# Patient Record
Sex: Male | Born: 1965 | Race: White | Hispanic: No | Marital: Married | Smoking: Current every day smoker
Health system: Southern US, Community
[De-identification: ages and names within clinical notes are randomized; demographics above are authoritative.]

## PROBLEM LIST (undated history)

## (undated) DIAGNOSIS — F419 Anxiety disorder, unspecified: Secondary | ICD-10-CM

## (undated) HISTORY — PX: EYE SURGERY: SHX253

## (undated) HISTORY — PX: HERNIA REPAIR: SHX51

## (undated) HISTORY — DX: Anxiety disorder, unspecified: F41.9

---

## 2004-02-17 ENCOUNTER — Emergency Department (HOSPITAL_COMMUNITY): Admission: EM | Admit: 2004-02-17 | Discharge: 2004-02-17 | Payer: Self-pay

## 2004-05-24 ENCOUNTER — Ambulatory Visit: Payer: Self-pay | Admitting: Family Medicine

## 2004-06-28 ENCOUNTER — Ambulatory Visit: Payer: Self-pay | Admitting: Family Medicine

## 2005-07-03 ENCOUNTER — Ambulatory Visit: Payer: Self-pay | Admitting: Family Medicine

## 2006-08-08 ENCOUNTER — Emergency Department (HOSPITAL_COMMUNITY): Admission: EM | Admit: 2006-08-08 | Discharge: 2006-08-08 | Payer: Self-pay | Admitting: Emergency Medicine

## 2013-02-01 ENCOUNTER — Telehealth: Payer: Self-pay | Admitting: *Deleted

## 2013-02-04 ENCOUNTER — Ambulatory Visit (INDEPENDENT_AMBULATORY_CARE_PROVIDER_SITE_OTHER): Payer: PRIVATE HEALTH INSURANCE | Admitting: Family Medicine

## 2013-02-04 ENCOUNTER — Encounter: Payer: Self-pay | Admitting: Family Medicine

## 2013-02-04 VITALS — BP 175/100 | HR 72 | Temp 97.2°F | Wt 235.0 lb

## 2013-02-04 DIAGNOSIS — R05 Cough: Secondary | ICD-10-CM

## 2013-02-04 DIAGNOSIS — I10 Essential (primary) hypertension: Secondary | ICD-10-CM

## 2013-02-04 DIAGNOSIS — J329 Chronic sinusitis, unspecified: Secondary | ICD-10-CM

## 2013-02-04 DIAGNOSIS — J209 Acute bronchitis, unspecified: Secondary | ICD-10-CM

## 2013-02-04 DIAGNOSIS — R059 Cough, unspecified: Secondary | ICD-10-CM

## 2013-02-04 MED ORDER — HYDROCODONE-HOMATROPINE 5-1.5 MG/5ML PO SYRP: 5.0000 mL | ORAL_SOLUTION | Freq: Three times a day (TID) | ORAL | Status: DC | PRN

## 2013-02-04 MED ORDER — TADALAFIL 20 MG PO TABS: 10.0000 mg | ORAL_TABLET | ORAL | Status: DC | PRN

## 2013-02-04 MED ORDER — AMLODIPINE BESYLATE 10 MG PO TABS: 10.0000 mg | ORAL_TABLET | Freq: Every day | ORAL | Status: DC

## 2013-02-04 MED ORDER — AMOXICILLIN-POT CLAVULANATE 875-125 MG PO TABS: 1.0000 | ORAL_TABLET | Freq: Two times a day (BID) | ORAL | Status: DC

## 2013-02-04 MED ORDER — METHYLPREDNISOLONE (PAK) 4 MG PO TABS: ORAL_TABLET | ORAL | Status: DC

## 2013-02-04 NOTE — Progress Notes (Signed)
  Subjective:    Patient ID: Donald Reeves, male    DOB: [birth year], 47 y.o.   MRN: [id number]  HPI This 47 y.o. male presents for evaluation of sinus drainage and URI sx's for over a week. He states he feels bad.   Review of Systems    No chest pain, SOB, HA, dizziness, vision change, N/V, diarrhea, constipation, dysuria, urinary urgency or frequency, myalgias, arthralgias or rash.  Objective:   Physical Exam  Vital signs noted  Well developed well nourished male.  HEENT - Head atraumatic Normocephalic                Eyes - PERRLA, Conjuctiva - clear Sclera- Clear EOMI                Ears - EAC's Wnl TM's Wnl Gross Hearing WNL                Nose - Nares patent                 Throat - oropharanx wnl Respiratory - Lungs CTA bilateral Cardiac - RRR S1 and S2 without murmur GI - Abdomen soft Nontender and bowel sounds active x 4 Extremities - No edema. Neuro - Grossly intact.      Assessment & Plan:  Acute bronchitis - Plan: amoxicillin-clavulanate (AUGMENTIN) 875-125 MG per tablet, methylPREDNIsolone (MEDROL DOSPACK) 4 MG tablet  Sinusitis - Plan: amoxicillin-clavulanate (AUGMENTIN) 875-125 MG per tablet, methylPREDNIsolone (MEDROL DOSPACK) 4 MG tablet  Cough - Plan: HYDROcodone-homatropine (HYCODAN) 5-1.5 MG/5ML syrup, DISCONTINUED: HYDROcodone-homatropine (HYCODAN) 5-1.5 MG/5ML syrup  Essential hypertension, benign - Plan: amLODipine (NORVASC) 10 MG tablet Follow up in one month

## 2013-02-04 NOTE — Patient Instructions (Signed)

## 2013-02-09 NOTE — Telephone Encounter (Signed)
error 

## 2013-02-12 ENCOUNTER — Other Ambulatory Visit: Payer: Self-pay | Admitting: Nurse Practitioner

## 2013-02-12 MED ORDER — PANTOPRAZOLE SODIUM 40 MG PO TBEC: 40.0000 mg | DELAYED_RELEASE_TABLET | Freq: Every day | ORAL | Status: DC

## 2013-02-25 ENCOUNTER — Ambulatory Visit: Payer: PRIVATE HEALTH INSURANCE | Admitting: Family Medicine

## 2013-03-11 ENCOUNTER — Other Ambulatory Visit: Payer: Self-pay | Admitting: Nurse Practitioner

## 2013-04-14 ENCOUNTER — Other Ambulatory Visit: Payer: Self-pay | Admitting: Family Medicine

## 2013-05-19 ENCOUNTER — Other Ambulatory Visit: Payer: Self-pay | Admitting: Family Medicine

## 2013-06-23 ENCOUNTER — Other Ambulatory Visit: Payer: Self-pay | Admitting: Nurse Practitioner

## 2013-06-23 MED ORDER — OSELTAMIVIR PHOSPHATE 75 MG PO CAPS: 75.0000 mg | ORAL_CAPSULE | Freq: Every day | ORAL | Status: DC

## 2013-06-25 ENCOUNTER — Other Ambulatory Visit: Payer: Self-pay | Admitting: Family Medicine

## 2013-06-28 NOTE — Telephone Encounter (Signed)
Last seen 02/04/13  B [place] 

## 2013-07-19 ENCOUNTER — Other Ambulatory Visit: Payer: Self-pay | Admitting: Nurse Practitioner

## 2013-07-20 NOTE — Telephone Encounter (Signed)
Last seen 02/04/13  B [place]

## 2013-07-21 ENCOUNTER — Encounter (HOSPITAL_COMMUNITY): Payer: Self-pay | Admitting: Emergency Medicine

## 2013-07-21 ENCOUNTER — Emergency Department (HOSPITAL_COMMUNITY)
Admission: EM | Admit: 2013-07-21 | Discharge: 2013-07-21 | Disposition: A | Discharge status: Home / Self Care | Payer: Managed Care, Other (non HMO) | Attending: Emergency Medicine | Admitting: Emergency Medicine

## 2013-07-21 DIAGNOSIS — T148XXA Other injury of unspecified body region, initial encounter: Secondary | ICD-10-CM

## 2013-07-21 DIAGNOSIS — S139XXA Sprain of joints and ligaments of unspecified parts of neck, initial encounter: Secondary | ICD-10-CM | POA: Insufficient documentation

## 2013-07-21 DIAGNOSIS — Y9389 Activity, other specified: Secondary | ICD-10-CM | POA: Insufficient documentation

## 2013-07-21 DIAGNOSIS — F17200 Nicotine dependence, unspecified, uncomplicated: Secondary | ICD-10-CM | POA: Insufficient documentation

## 2013-07-21 DIAGNOSIS — Z79899 Other long term (current) drug therapy: Secondary | ICD-10-CM | POA: Insufficient documentation

## 2013-07-21 DIAGNOSIS — Y92410 Unspecified street and highway as the place of occurrence of the external cause: Secondary | ICD-10-CM | POA: Insufficient documentation

## 2013-07-21 MED ORDER — CYCLOBENZAPRINE HCL 10 MG PO TABS: 10.0000 mg | ORAL_TABLET | Freq: Three times a day (TID) | ORAL | Status: DC | PRN
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
## 2013-07-24 NOTE — ED Provider Notes (Signed)
Medical screening examination/treatment/procedure(s) were performed by non-physician practitioner and as supervising physician I was immediately available for consultation/collaboration.  EKG Interpretation   None         Wilba Mutz, MD 07/24/13 0715 

## 2013-07-26 ENCOUNTER — Other Ambulatory Visit: Payer: Self-pay | Admitting: Nurse Practitioner

## 2013-07-27 NOTE — Telephone Encounter (Signed)
Last saw you in 07/14, not sure if depression or cymbalta was addressed

## 2013-07-28 ENCOUNTER — Telehealth: Payer: Self-pay | Admitting: Family Medicine

## 2013-08-26 ENCOUNTER — Other Ambulatory Visit: Payer: Self-pay | Admitting: Family Medicine

## 2013-08-30 ENCOUNTER — Other Ambulatory Visit: Payer: Self-pay | Admitting: Family Medicine

## 2013-08-30 NOTE — Telephone Encounter (Signed)
Patient last seen in office on 02-04-13. Please advise

## 2013-09-01 NOTE — Telephone Encounter (Signed)
Last seen 07/14 

## 2013-09-03 ENCOUNTER — Other Ambulatory Visit: Payer: Self-pay | Admitting: Nurse Practitioner

## 2013-09-07 ENCOUNTER — Telehealth: Payer: Self-pay | Admitting: Nurse Practitioner

## 2013-09-07 NOTE — Telephone Encounter (Signed)
Last seen 02/04/13  B [place]

## 2013-09-09 ENCOUNTER — Telehealth: Payer: Self-pay | Admitting: Nurse Practitioner

## 2013-09-09 ENCOUNTER — Encounter: Payer: Self-pay | Admitting: Nurse Practitioner

## 2013-09-09 ENCOUNTER — Ambulatory Visit (INDEPENDENT_AMBULATORY_CARE_PROVIDER_SITE_OTHER): Payer: Managed Care, Other (non HMO) | Admitting: Nurse Practitioner

## 2013-09-09 VITALS — BP 136/84 | HR 72 | Temp 97.9°F | Ht 73.0 in | Wt 233.0 lb

## 2013-09-09 DIAGNOSIS — F410 Panic disorder [episodic paroxysmal anxiety] without agoraphobia: Secondary | ICD-10-CM

## 2013-09-09 DIAGNOSIS — F411 Generalized anxiety disorder: Secondary | ICD-10-CM | POA: Insufficient documentation

## 2013-09-09 MED ORDER — ALPRAZOLAM 0.25 MG PO TABS: 0.2500 mg | ORAL_TABLET | Freq: Two times a day (BID) | ORAL | Status: DC | PRN

## 2013-09-09 MED ORDER — VORTIOXETINE HBR 10 MG PO TABS: 1.0000 | ORAL_TABLET | Freq: Every day | ORAL | Status: DC

## 2013-09-09 MED ORDER — DULOXETINE HCL 30 MG PO CPEP: 30.0000 mg | ORAL_CAPSULE | Freq: Every day | ORAL | Status: DC

## 2013-09-09 NOTE — Telephone Encounter (Signed)
appt with mmm on 3/5

## 2013-09-09 NOTE — Patient Instructions (Addendum)
  1. Panic attacks   2. GAD (generalized anxiety disorder)    Meds ordered this encounter  Medications  . DULoxetine (CYMBALTA) 30 MG capsule    Sig: Take 1 capsule (30 mg total) by mouth daily.    Dispense:  30 capsule    Refill:  1    Order Specific Question:  Supervising Provider    Answer:  Ernestina PennaMOORE, DONALD W [[id number]]  . Vortioxetine HBr (BRINTELLIX) 10 MG TABS    Sig: Take 1 tablet (10 mg total) by mouth daily.    Dispense:  30 tablet    Refill:  0    Order Specific Question:  Supervising Provider    Answer:  Ernestina PennaMOORE, DONALD W [[id number]]  . ALPRAZolam (XANAX) 0.25 MG tablet    Sig: Take 1 tablet (0.25 mg total) by mouth 2 (two) times daily as needed for anxiety.    Dispense:  40 tablet    Refill:  0    Order Specific Question:  Supervising Provider    Answer:  Christell ConstantMOORE, DONALD W [[id number]]    Wean off cymbalta- 60mg  qod alt with 30mg  X1 week- then 30 mg daily X1 week- then 30mg  qod alt with brintellex 10mg  X 1 week then straightt to brentellex Xanax as needed Stress management Follow up in 3 weeks  Mary-Margaret Daphine DeutscherMartin, FNP
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
## 2013-09-09 NOTE — Telephone Encounter (Signed)
Patient wife aware

## 2013-09-09 NOTE — Telephone Encounter (Signed)
I gave him samples of brintellex- he doesn't need it called in yert has enough to last until he is seen again.

## 2013-09-13 ENCOUNTER — Telehealth: Payer: Self-pay | Admitting: Nurse Practitioner

## 2013-09-13 NOTE — Telephone Encounter (Signed)
Patient was concerned that he made me uncomfortable at visit- I told him not at all.

## 2013-09-28 ENCOUNTER — Telehealth: Payer: Self-pay | Admitting: Nurse Practitioner

## 2013-09-28 NOTE — Telephone Encounter (Signed)
appt given for thurs 

## 2013-09-30 ENCOUNTER — Ambulatory Visit (INDEPENDENT_AMBULATORY_CARE_PROVIDER_SITE_OTHER): Payer: Managed Care, Other (non HMO) | Admitting: Nurse Practitioner

## 2013-09-30 ENCOUNTER — Encounter: Payer: Self-pay | Admitting: Nurse Practitioner

## 2013-09-30 VITALS — BP 134/88 | HR 76 | Temp 98.4°F | Ht 73.0 in | Wt 234.4 lb

## 2013-09-30 DIAGNOSIS — F411 Generalized anxiety disorder: Secondary | ICD-10-CM

## 2013-09-30 DIAGNOSIS — F410 Panic disorder [episodic paroxysmal anxiety] without agoraphobia: Secondary | ICD-10-CM

## 2013-09-30 MED ORDER — ESCITALOPRAM OXALATE 10 MG PO TABS: 10.0000 mg | ORAL_TABLET | Freq: Every day | ORAL | Status: DC

## 2013-09-30 NOTE — Progress Notes (Signed)
   Subjective:    Patient ID: Donald Reeves, male    DOB: 1965/09/22, 48 y.o.   MRN: [id number]  HPI  Patient was seenon March 5,2015 c/o what we diagnosed as panic attacks- we started him on brintellex and trying  wean him off of cymbalta. He is currently alternating the meds daily. Patient says he is feeling better bit brintellex seems to keep him awake but he has really only taken a few days. Has been on other anxiety meds but not for anxiety. He doesn't think he can tolerate brintellex. Says that he tried lexapro for headache and not anxiety and it was years ago.    Review of Systems  Constitutional: Negative.   HENT: Negative.   Respiratory: Negative.   Cardiovascular: Negative.   Psychiatric/Behavioral: Negative.   All other systems reviewed and are negative.       Objective:   Physical Exam  Constitutional: He appears well-developed and well-nourished.  Cardiovascular: Normal rate, regular rhythm and normal heart sounds.   Pulmonary/Chest: Effort normal and breath sounds normal.  Abdominal: Soft. Bowel sounds are normal.  Skin: Skin is warm and dry.  Psychiatric: He has a normal mood and affect. His behavior is normal. Judgment and thought content normal.   BP 134/88  Pulse 76  Temp(Src) 98.4 F (36.9 C) (Oral)  Ht 6\' 1"  (1.854 m)  Wt 234 lb 6.4 oz (106.323 kg)  BMI 30.93 kg/m2        Assessment & Plan:   1. GAD (generalized anxiety disorder)   2. Panic attacks    Meds ordered this encounter  Medications  . escitalopram (LEXAPRO) 10 MG tablet    Sig: Take 1 tablet (10 mg total) by mouth daily.    Dispense:  30 tablet    Refill:  3    Order Specific Question:  Supervising Provider    Answer:  Ernestina PennaMOORE, DONALD W [[id number]]   Stop brintellex and replace with lexapro Continue to wean off cymbalta Stress management Follow up prn  Mary-Margaret Daphine DeutscherMartin, FNP

## 2013-09-30 NOTE — Patient Instructions (Signed)
Panic Attacks  Panic attacks are sudden, short-lived surges of severe anxiety, fear, or discomfort. They may occur for no reason when you are relaxed, when you are anxious, or when you are sleeping. Panic attacks may occur for a number of reasons:   · Healthy people occasionally have panic attacks in extreme, life-threatening situations, such as war or natural disasters. Normal anxiety is a protective mechanism of the body that helps us react to danger (fight or flight response).  · Panic attacks are often seen with anxiety disorders, such as panic disorder, social anxiety disorder, generalized anxiety disorder, and phobias. Anxiety disorders cause excessive or uncontrollable anxiety. They may interfere with your relationships or other life activities.  · Panic attacks are sometimes seen with other mental illnesses such as depression and posttraumatic stress disorder.  · Certain medical conditions, prescription medicines, and drugs of abuse can cause panic attacks.  SYMPTOMS   Panic attacks start suddenly, peak within 20 minutes, and are accompanied by four or more of the following symptoms:  · Pounding heart or fast heart rate (palpitations).  · Sweating.  · Trembling or shaking.  · Shortness of breath or feeling smothered.  · Feeling choked.  · Chest pain or discomfort.  · Nausea or strange feeling in your stomach.  · Dizziness, lightheadedness, or feeling like you will faint.  · Chills or hot flushes.  · Numbness or tingling in your lips or hands and feet.  · Feeling that things are not real or feeling that you are not yourself.  · Fear of losing control or going crazy.  · Fear of dying.  Some of these symptoms can mimic serious medical conditions. For example, you may think you are having a heart attack. Although panic attacks can be very scary, they are not life threatening.  DIAGNOSIS   Panic attacks are diagnosed through an assessment by your health care provider. Your health care provider will ask questions  about your symptoms, such as where and when they occurred. Your health care provider will also ask about your medical history and use of alcohol and drugs, including prescription medicines. Your health care provider may order blood tests or other studies to rule out a serious medical condition. Your health care provider may refer you to a mental health professional for further evaluation.  TREATMENT   · Most healthy people who have one or two panic attacks in an extreme, life-threatening situation will not require treatment.  · The treatment for panic attacks associated with anxiety disorders or other mental illness typically involves counseling with a mental health professional, medicine, or a combination of both. Your health care provider will help determine what treatment is best for you.  · Panic attacks due to physical illness usually goes away with treatment of the illness. If prescription medicine is causing panic attacks, talk with your health care provider about stopping the medicine, decreasing the dose, or substituting another medicine.  · Panic attacks due to alcohol or drug abuse goes away with abstinence. Some adults need professional help in order to stop drinking or using drugs.  HOME CARE INSTRUCTIONS   · Take all your medicines as prescribed.    · Check with your health care provider before starting new prescription or over-the-counter medicines.  · Keep all follow up appointments with your health care provider.  SEEK MEDICAL CARE IF:  · You are not able to take your medicines as prescribed.  · Your symptoms do not improve or get worse.  SEEK IMMEDIATE   MEDICAL CARE IF:   · You experience panic attack symptoms that are different than your usual symptoms.  · You have serious thoughts about hurting yourself or others.  · You are taking medicine for panic attacks and have a serious side effect.  MAKE SURE YOU:  · Understand these instructions.  · Will watch your condition.  · Will get help right away  if you are not doing well or get worse.  Document Released: 06/24/2005 Document Revised: 04/14/2013 Document Reviewed: 02/05/2013  Exit[employer]® Patient Information ©2014 [employer].

## 2013-10-18 ENCOUNTER — Other Ambulatory Visit: Payer: Self-pay | Admitting: Family Medicine

## 2013-11-01 ENCOUNTER — Encounter: Payer: Self-pay | Admitting: Nurse Practitioner

## 2013-11-01 ENCOUNTER — Ambulatory Visit (INDEPENDENT_AMBULATORY_CARE_PROVIDER_SITE_OTHER): Payer: Managed Care, Other (non HMO) | Admitting: Nurse Practitioner

## 2013-11-01 VITALS — BP 125/71 | HR 77 | Temp 97.6°F | Ht 73.0 in | Wt 237.0 lb

## 2013-11-01 DIAGNOSIS — F411 Generalized anxiety disorder: Secondary | ICD-10-CM

## 2013-11-01 DIAGNOSIS — F410 Panic disorder [episodic paroxysmal anxiety] without agoraphobia: Secondary | ICD-10-CM

## 2013-11-01 NOTE — Patient Instructions (Signed)
Stress Management Stress is a state of physical or mental tension that often results from changes in your life or normal routine. Some common causes of stress are:  Death of a loved one.  Injuries or severe illnesses.  Getting fired or changing jobs.  Moving into a new home. Other causes may be:  Sexual problems.  Business or financial losses.  Taking on a large debt.  Regular conflict with someone at home or at work.  Constant tiredness from lack of sleep. It is not just bad things that are stressful. It may be stressful to:  Win the lottery.  Get married.  Buy a new car. The amount of stress that can be easily tolerated varies from person to person. Changes generally cause stress, regardless of the types of change. Too much stress can affect your health. It may lead to physical or emotional problems. Too little stress (boredom) may also become stressful. SUGGESTIONS TO REDUCE STRESS:  Talk things over with your family and friends. It often is helpful to share your concerns and worries. If you feel your problem is serious, you may want to get help from a professional counselor.  Consider your problems one at a time instead of lumping them all together. Trying to take care of everything at once may seem impossible. List all the things you need to do and then start with the most important one. Set a goal to accomplish 2 or 3 things each day. If you expect to do too many in a single day you will naturally fail, causing you to feel even more stressed.  Do not use alcohol or drugs to relieve stress. Although you may feel better for a short time, they do not remove the problems that caused the stress. They can also be habit forming.  Exercise regularly - at least 3 times per week. Physical exercise can help to relieve that "uptight" feeling and will relax you.  The shortest distance between despair and hope is often a good night's sleep.  Go to bed and get up on time allowing  yourself time for appointments without being rushed.  Take a short "time-out" period from any stressful situation that occurs during the day. Close your eyes and take some deep breaths. Starting with the muscles in your face, tense them, hold it for a few seconds, then relax. Repeat this with the muscles in your neck, shoulders, hand, stomach, back and legs.  Take good care of yourself. Eat a balanced diet and get plenty of rest.  Schedule time for having fun. Take a break from your daily routine to relax. HOME CARE INSTRUCTIONS   Call if you feel overwhelmed by your problems and feel you can no longer manage them on your own.  Return immediately if you feel like hurting yourself or someone else. Document Released: 12/18/2000 Document Revised: 09/16/2011 Document Reviewed: 02/16/2013 Exit[employer] Patient Information 2014 [employer].  

## 2013-11-01 NOTE — Progress Notes (Signed)
   Subjective:    Patient ID: Donald Reeves, male    DOB: [birth year], 48 y.o.   MRN: [id number]  HPI Patient here today for follow up- Dx with anxiety and panic attacks- He was started on lexapro at last visit- He said he is doing much better then he was- Does have sexual side effects. Says that he is okay with that for now- He has some cialis at hoem that he will try when ready.    Review of Systems  Constitutional: Negative.   HENT: Negative.   Respiratory: Negative.   Cardiovascular: Negative.   Gastrointestinal: Negative.   Genitourinary: Negative.   Psychiatric/Behavioral: Negative.   All other systems reviewed and are negative.      Objective:   Physical Exam  Constitutional: He is oriented to person, place, and time. He appears well-developed and well-nourished.  Cardiovascular: Normal rate, regular rhythm and normal heart sounds.   Pulmonary/Chest: Effort normal and breath sounds normal.  Abdominal: Soft. Bowel sounds are normal.  Neurological: He is alert and oriented to person, place, and time.  Skin: Skin is warm and dry.  Psychiatric: He has a normal mood and affect. His behavior is normal. Judgment and thought content normal.    BP 125/71  Pulse 77  Temp(Src) 97.6 F (36.4 C) (Oral)  Ht 6\' 1"  (1.854 m)  Wt 237 lb (107.502 kg)  BMI 31.27 kg/m2       Assessment & Plan:   1. Panic attacks   2. GAD (generalized anxiety disorder)    Continue lexapro as rx daily Follow up in 3 months RTO prn  Mary-Margaret Daphine DeutscherMartin, FNP

## 2013-12-03 ENCOUNTER — Telehealth: Payer: Self-pay | Admitting: Nurse Practitioner

## 2013-12-05 ENCOUNTER — Other Ambulatory Visit: Payer: Self-pay | Admitting: Nurse Practitioner

## 2013-12-06 MED ORDER — ALPRAZOLAM 0.25 MG PO TABS: 0.2500 mg | ORAL_TABLET | Freq: Two times a day (BID) | ORAL | Status: DC | PRN

## 2013-12-06 NOTE — Telephone Encounter (Signed)
Called in.

## 2013-12-06 NOTE — Telephone Encounter (Signed)
Xanax 0.25 1po BID prn #[id number] refills

## 2013-12-22 ENCOUNTER — Ambulatory Visit: Payer: Managed Care, Other (non HMO) | Admitting: Nurse Practitioner

## 2014-01-04 ENCOUNTER — Encounter: Payer: Self-pay | Admitting: Physician Assistant

## 2014-01-04 ENCOUNTER — Ambulatory Visit (INDEPENDENT_AMBULATORY_CARE_PROVIDER_SITE_OTHER): Payer: 59 | Admitting: Physician Assistant

## 2014-01-04 VITALS — BP 130/98 | HR 60 | Temp 99.3°F | Ht 73.0 in | Wt 233.6 lb

## 2014-01-04 DIAGNOSIS — F4323 Adjustment disorder with mixed anxiety and depressed mood: Secondary | ICD-10-CM

## 2014-01-04 DIAGNOSIS — F410 Panic disorder [episodic paroxysmal anxiety] without agoraphobia: Secondary | ICD-10-CM

## 2014-01-04 MED ORDER — ESCITALOPRAM OXALATE 20 MG PO TABS: ORAL_TABLET | ORAL | Status: DC

## 2014-01-04 NOTE — Patient Instructions (Signed)
Generalized Anxiety Disorder  Generalized anxiety disorder (GAD) is a mental disorder. It interferes with life functions, including relationships, work, and school.  GAD is different from normal anxiety, which everyone experiences at some point in their lives in response to specific life events and activities. Normal anxiety actually helps us prepare for and get through these life events and activities. Normal anxiety goes away after the event or activity is over.   GAD causes anxiety that is not necessarily related to specific events or activities. It also causes excess anxiety in proportion to specific events or activities. The anxiety associated with GAD is also difficult to control. GAD can vary from mild to severe. People with severe GAD can have intense waves of anxiety with physical symptoms (panic attacks).   SYMPTOMS  The anxiety and worry associated with GAD are difficult to control. This anxiety and worry are related to many life events and activities and also occur more days than not for 6 months or longer. People with GAD also have three or more of the following symptoms (one or more in children):  · Restlessness.    · Fatigue.  · Difficulty concentrating.    · Irritability.  · Muscle tension.  · Difficulty sleeping or unsatisfying sleep.  DIAGNOSIS  GAD is diagnosed through an assessment by your caregiver. Your caregiver will ask you questions about your mood, physical symptoms, and events in your life. Your caregiver may ask you about your medical history and use of alcohol or drugs, including prescription medications. Your caregiver may also do a physical exam and blood tests. Certain medical conditions and the use of certain substances can cause symptoms similar to those associated with GAD. Your caregiver may refer you to a mental health specialist for further evaluation.  TREATMENT  The following therapies are usually used to treat GAD:   · Medication--Antidepressant medication usually is  prescribed for long-term daily control. Antianxiety medications may be added in severe cases, especially when panic attacks occur.    · Talk therapy (psychotherapy)--Certain types of talk therapy can be helpful in treating GAD by providing support, education, and guidance. A form of talk therapy called cognitive behavioral therapy can teach you healthy ways to think about and react to daily life events and activities.  · Stress management techniques--These include yoga, meditation, and exercise and can be very helpful when they are practiced regularly.  A mental health specialist can help determine which treatment is best for you. Some people see improvement with one therapy. However, other people require a combination of therapies.  Document Released: 10/19/2012 Document Reviewed: 10/19/2012  ExitCare® Patient Information ©2015 [employer]. This information is not intended to replace advice given to you by your health care provider. Make sure you discuss any questions you have with your health care provider.
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
## 2014-01-27 ENCOUNTER — Telehealth: Payer: Self-pay | Admitting: *Deleted

## 2014-01-27 MED ORDER — AZITHROMYCIN 250 MG PO TABS: ORAL_TABLET | ORAL | Status: DC

## 2014-01-27 NOTE — Telephone Encounter (Signed)
Le patient know [facility]- needs to take delsym OTC and force fluids

## 2014-01-27 NOTE — Telephone Encounter (Signed)
Patient aware.

## 2014-02-10 ENCOUNTER — Other Ambulatory Visit: Payer: Self-pay | Admitting: Physician Assistant

## 2014-02-14 ENCOUNTER — Other Ambulatory Visit: Payer: Self-pay | Admitting: *Deleted

## 2014-02-14 MED ORDER — ESCITALOPRAM OXALATE 20 MG PO TABS: ORAL_TABLET | ORAL | Status: DC

## 2014-02-17 ENCOUNTER — Other Ambulatory Visit: Payer: Self-pay | Admitting: Family Medicine

## 2014-05-02 ENCOUNTER — Other Ambulatory Visit: Payer: Self-pay | Admitting: Nurse Practitioner

## 2014-06-03 ENCOUNTER — Ambulatory Visit: Payer: 59 | Admitting: Family Medicine

## 2014-08-12 ENCOUNTER — Other Ambulatory Visit: Payer: Self-pay | Admitting: Family Medicine

## 2014-09-12 ENCOUNTER — Other Ambulatory Visit: Payer: Self-pay | Admitting: Family Medicine

## 2014-10-22 ENCOUNTER — Other Ambulatory Visit: Payer: Self-pay | Admitting: Physician Assistant

## 2014-10-24 NOTE — Telephone Encounter (Signed)
Last seen 06/15

## 2014-11-21 ENCOUNTER — Telehealth: Payer: Self-pay | Admitting: Nurse Practitioner

## 2014-11-21 NOTE — Telephone Encounter (Signed)
Patient complains of nasal congestion and sinus pressure. Appt scheduled for tomorrow at 5. Patient aware.

## 2014-11-22 ENCOUNTER — Ambulatory Visit (INDEPENDENT_AMBULATORY_CARE_PROVIDER_SITE_OTHER): Payer: 59 | Admitting: Physician Assistant

## 2014-11-22 ENCOUNTER — Encounter: Payer: Self-pay | Admitting: Physician Assistant

## 2014-11-22 VITALS — BP 147/87 | HR 69 | Temp 98.4°F | Ht 73.0 in | Wt 244.8 lb

## 2014-11-22 DIAGNOSIS — J209 Acute bronchitis, unspecified: Secondary | ICD-10-CM | POA: Diagnosis not present

## 2014-11-22 DIAGNOSIS — R062 Wheezing: Secondary | ICD-10-CM

## 2014-11-22 MED ORDER — HYDROCODONE-HOMATROPINE 5-1.5 MG/5ML PO SYRP: 5.0000 mL | ORAL_SOLUTION | Freq: Three times a day (TID) | ORAL | Status: DC | PRN

## 2014-11-22 MED ORDER — PREDNISONE 10 MG (21) PO TBPK: ORAL_TABLET | ORAL | Status: DC

## 2014-11-22 MED ORDER — ALBUTEROL SULFATE HFA 108 (90 BASE) MCG/ACT IN AERS: 2.0000 | INHALATION_SPRAY | Freq: Four times a day (QID) | RESPIRATORY_TRACT | Status: DC | PRN

## 2014-11-22 MED ORDER — AZITHROMYCIN 250 MG PO TABS: ORAL_TABLET | ORAL | Status: DC

## 2014-11-22 NOTE — Patient Instructions (Signed)
Acute Bronchitis Bronchitis is when the airways that extend from the windpipe into the lungs get red, puffy, and painful (inflamed). Bronchitis often causes thick spit (mucus) to develop. This leads to a cough. A cough is the most common symptom of bronchitis. In acute bronchitis, the condition usually begins suddenly and goes away over time (usually in 2 weeks). Smoking, allergies, and asthma can make bronchitis worse. Repeated episodes of bronchitis may cause more lung problems. HOME CARE  Rest.  Drink enough fluids to keep your pee (urine) clear or pale yellow (unless you need to limit fluids as told by your doctor).  Only take over-the-counter or prescription medicines as told by your doctor.  Avoid smoking and secondhand smoke. These can make bronchitis worse. If you are a smoker, think about using nicotine gum or skin patches. Quitting smoking will help your lungs heal faster.  Reduce the chance of getting bronchitis again by:  Washing your hands often.  Avoiding people with cold symptoms.  Trying not to touch your hands to your mouth, nose, or eyes.  Follow up with your doctor as told. GET HELP IF: Your symptoms do not improve after 1 week of treatment. Symptoms include:  Cough.  Fever.  Coughing up thick spit.  Body aches.  Chest congestion.  Chills.  Shortness of breath.  Sore throat. GET HELP RIGHT AWAY IF:   You have an increased fever.  You have chills.  You have severe shortness of breath.  You have bloody thick spit (sputum).  You throw up (vomit) often.  You lose too much body fluid (dehydration).  You have a severe headache.  You faint. MAKE SURE YOU:   Understand these instructions.  Will watch your condition.  Will get help right away if you are not doing well or get worse. Document Released: 12/11/2007 Document Revised: 02/24/2013 Document Reviewed: 12/15/2012 ExitCare Patient Information 2015 [employer]. This information is not  intended to replace advice given to you by your health care provider. Make sure you discuss any questions you have with your health care provider.  

## 2014-11-22 NOTE — Progress Notes (Signed)
   Subjective:    Patient ID: Donata ClayJonathan Muench, male    DOB: Jan 05, 1966, 49 y.o.   MRN: [id number]  HPI 49 y/o male presents with c/o worsening cough x 1 week. Began with nasal , head congestion. Has not tried any medications. No aggravating or relieving factors.     Review of Systems  Constitutional: Negative.   HENT: Positive for sinus pressure. Negative for sore throat. Congestion: head, nasal    Respiratory: Positive for cough (intermittently productive ) and wheezing. Negative for shortness of breath.   Cardiovascular: Negative.   All other systems reviewed and are negative.      Objective:   Physical Exam  Constitutional: He is oriented to person, place, and time. He appears well-developed and well-nourished. No distress.  HENT:  Head: Normocephalic.  Cardiovascular: Normal rate, regular rhythm, normal heart sounds and intact distal pulses.  Exam reveals no gallop and no friction rub.   No murmur heard. Pulmonary/Chest: Effort normal. No respiratory distress. He has wheezes (inspiratory and expiratory bilaterally anterior and posterior BS). He has no rales. He exhibits no tenderness.  Neurological: He is alert and oriented to person, place, and time.  Skin: He is not diaphoretic.  Psychiatric: He has a normal mood and affect. His behavior is normal. Judgment and thought content normal.  Nursing note and vitals reviewed.         Assessment & Plan:  1. Acute bronchitis, unspecified organism  - predniSONE (STERAPRED UNI-PAK 21 TAB) 10 MG (21) TBPK tablet; Take as directed  Dispense: 21 tablet; Refill: 0 - HYDROcodone-homatropine (HYCODAN) 5-1.5 MG/5ML syrup; Take 5 mLs by mouth every 8 (eight) hours as needed for cough.  Dispense: 120 mL; Refill: 0 - azithromycin (ZITHROMAX) 250 MG tablet; 2 tablets PO on day 1, then 1 tablet PO on day 2-5  Dispense: 6 tablet; Refill: 0  2. Wheeze  - predniSONE (STERAPRED UNI-PAK 21 TAB) 10 MG (21) TBPK tablet; Take as directed  Dispense:  21 tablet; Refill: 0 - HYDROcodone-homatropine (HYCODAN) 5-1.5 MG/5ML syrup; Take 5 mLs by mouth every 8 (eight) hours as needed for cough.  Dispense: 120 mL; Refill: 0 - azithromycin (ZITHROMAX) 250 MG tablet; 2 tablets PO on day 1, then 1 tablet PO on day 2-5  Dispense: 6 tablet; Refill: 0  - Cool mist humidifier - Plain musinex as directed.  - drink plenty of fluids    Continue all meds  RTO 1 week   Tiffany A. Chauncey ReadingGann PA-C

## 2014-11-30 ENCOUNTER — Ambulatory Visit: Payer: 59 | Admitting: Physician Assistant

## 2015-01-02 ENCOUNTER — Other Ambulatory Visit: Payer: Self-pay | Admitting: Nurse Practitioner

## 2015-03-06 ENCOUNTER — Other Ambulatory Visit: Payer: Self-pay | Admitting: Physician Assistant

## 2015-03-09 ENCOUNTER — Other Ambulatory Visit: Payer: Self-pay | Admitting: Family

## 2015-04-11 ENCOUNTER — Other Ambulatory Visit: Payer: Self-pay | Admitting: Family

## 2015-05-08 ENCOUNTER — Other Ambulatory Visit: Payer: Self-pay | Admitting: Family Medicine

## 2015-05-12 ENCOUNTER — Encounter: Payer: Self-pay | Admitting: Family Medicine

## 2015-05-12 ENCOUNTER — Ambulatory Visit (INDEPENDENT_AMBULATORY_CARE_PROVIDER_SITE_OTHER): Payer: 59 | Admitting: Family Medicine

## 2015-05-12 VITALS — BP 139/95 | HR 72 | Temp 97.2°F | Ht 73.0 in | Wt 251.0 lb

## 2015-05-12 DIAGNOSIS — B070 Plantar wart: Secondary | ICD-10-CM

## 2015-05-12 NOTE — Progress Notes (Signed)
   HPI  Patient presents today here with a painful spot on his right foot.  Patient states it is been there for 4-8 weeks. States that it was initially dark colored and has turned yellow over the last few weeks. He has pain with walking. He has no fever, chills, redness of the area, or swelling there. He's not tried any medications or therapies over-the-counter for this problem.  PMH: Smoking status noted ROS: Per HPI  Objective: BP 139/95 mmHg  Pulse 72  Temp(Src) 97.2 F (36.2 C) (Oral)  Ht 6\' 1"  (1.854 m)  Wt 251 lb (113.853 kg)  BMI 33.12 kg/m2 Gen: NAD, alert, cooperative with exam Ext: No edema, warm Neuro: Alert and oriented, No gross deficits Skin: 5mm yellow hyperkeratotic nodule on R foot in mid arch, mild tenderness to palpation   Pared down using 15 blade  To a soft core, tolerated without bleeding or pain.   Assessment and plan:  # Plantrar wart Pared own in clinic today with mild improvement in symptoms Recommend compound W and corn pad X 2 weeks, RTC if worsens or call if concern for infection Consider Dr. Ulice Brilliantrake for podiatry if needed   Orders Placed This Encounter  Procedures  . Ambulatory referral to Podiatry    Referral Priority:  Routine    Referral Type:  Consultation    Referral Reason:  Specialty Services Required    Requested Specialty:  Podiatry    Number of Visits Requested:  1    No orders of the defined types were placed in this encounter.    Murtis SinkSam Anaiah Mcmannis, MD [facility] 05/12/2015, 9:28 AM

## 2015-05-12 NOTE — Patient Instructions (Addendum)
Great to meet you!  Before applying product, soak area in warm water for 5 minutes; dry area thoroughly, then apply medication.  Compound W : Apply one drop to cover corn or callus. Let dry. Repeat once or twice daily until corn is removed for up to 14 days. .  Use a corn pad for comfort  Please let us know if you develop redness, warmth, or suden worsening of pain at that site as these could be signs of infection.   Plantar Warts Plantar warts are small growths on the bottom of the foot (sole). Warts are caused by a type of germ (virus). Most warts are not painful, and they usually do not cause problems. Sometimes, plantar warts can cause pain when you walk. Warts often go away on their own in time. Treatments may be done if needed. HOME CARE General Instructions  Apply creams or solutions only as told by your doctor. Follow these steps if your doctor tells you to do so:  Soak your foot in warm water.  Remove the top layer of softened skin before you apply the medicine. You can use a pumice stone to remove the tissue.  After you apply the medicine, put a bandage over the area of the wart.  Repeat the process every day or as told by your doctor.  Do not scratch or pick at a wart.  Wash your hands after you touch a wart.  If a wart is painful, try putting a bandage with a hole in the middle over the wart.  Keep all follow-up visits as told by your doctor. This is important. Prevention  Wear shoes and socks. Change socks every day.  Keep your feet clean and dry.  Check your feet often.  Avoid direct contact with warts on other people. GET HELP IF:  Your warts do not improve after treatment.  You have redness, swelling, or pain at the site of a wart.  You have bleeding from a wart, and the bleeding does not stop when you put light pressure on the wart.  You have diabetes and you get a wart.   This information is not intended to replace advice given to you by your  health care provider. Make sure you discuss any questions you have with your health care provider.   Document Released: 07/27/2010 Document Revised: 03/15/2015 Document Reviewed: 09/19/2014 Elsevier Interactive Patient Education [employer]2016 Else[employer] Inc.

## 2015-06-11 ENCOUNTER — Other Ambulatory Visit: Payer: Self-pay | Admitting: Family Medicine

## 2015-07-03 ENCOUNTER — Other Ambulatory Visit: Payer: Self-pay | Admitting: Family Medicine

## 2015-09-21 ENCOUNTER — Other Ambulatory Visit: Payer: Self-pay | Admitting: *Deleted

## 2015-09-21 MED ORDER — ESCITALOPRAM OXALATE 20 MG PO TABS: 20.0000 mg | ORAL_TABLET | Freq: Every day | ORAL | Status: DC

## 2015-11-23 ENCOUNTER — Other Ambulatory Visit: Payer: Self-pay | Admitting: Family Medicine

## 2015-11-23 NOTE — Telephone Encounter (Signed)
Last seen 07/21/14 MMM 

## 2015-11-28 ENCOUNTER — Ambulatory Visit (INDEPENDENT_AMBULATORY_CARE_PROVIDER_SITE_OTHER): Payer: 59 | Admitting: Family Medicine

## 2015-11-28 ENCOUNTER — Encounter: Payer: Self-pay | Admitting: Family Medicine

## 2015-11-28 VITALS — BP 150/92 | HR 67 | Temp 99.1°F | Ht 73.0 in | Wt 245.0 lb

## 2015-11-28 DIAGNOSIS — R062 Wheezing: Secondary | ICD-10-CM

## 2015-11-28 DIAGNOSIS — J209 Acute bronchitis, unspecified: Secondary | ICD-10-CM

## 2015-11-28 DIAGNOSIS — R0982 Postnasal drip: Secondary | ICD-10-CM

## 2015-11-28 MED ORDER — METHYLPREDNISOLONE ACETATE 80 MG/ML IJ SUSP
80.0000 mg | Freq: Once | INTRAMUSCULAR | Status: AC
  Administered 2015-11-28: 80 mg via INTRAMUSCULAR

## 2015-11-28 MED ORDER — ALBUTEROL SULFATE HFA 108 (90 BASE) MCG/ACT IN AERS: 2.0000 | INHALATION_SPRAY | Freq: Four times a day (QID) | RESPIRATORY_TRACT | Status: DC | PRN

## 2015-11-28 NOTE — Progress Notes (Signed)
   HPI  Patient presents today here with wheezing and cough.  Patient's lines and she's had symptoms for about 1-2 months. He states that it's irritating off and on wheezing with mild shortness of breath and cough. No chest pain No fevers, chills, sweats.  He smokes about one pack per day for the last 29 years. He is considering quitting smoking.  He has frequent throat clearing, nasal congestion. He has done well with nasal sprays in the past, he is not taking any allergy medications as time.    PMH: Smoking status noted ROS: Per HPI  Objective: BP 150/92 mmHg  Pulse 67  Temp(Src) 99.1 F (37.3 C) (Oral)  Ht 6\' 1"  (1.854 m)  Wt 245 lb (111.131 kg)  BMI 32.33 kg/m2  SpO2 99% Gen: NAD, alert, cooperative with exam HEENT: NCAT, nares with swollen turbinates left greater than right, TMs normal bilaterally, oropharynx clear CV: RRR, good S1/S2, no murmur Resp: Nonlabored, expiratory wheezes throughout Ext: No edema, warm Neuro: Alert and oriented, No gross deficits  Assessment and plan:  # Wheezing, acute bronchitis Likely multifactorial, however I'm concerned about the development of COPD Given a shot of IM steroids in clinic Start Flonase Consider quitting smoking Albuterol for cough and wheeze, after current illness is improved if he is using it more than 3-4 times a week I recommend having back for more aggressive treatment and spirometry  # Tobacco abuse Discussed quitting, he is contemplative  # Postnasal drip Most likely allergic rhinitis Recommended nasocort first is he thinks that he will have good compliance with nasal sprays   Meds ordered this encounter  Medications  . methylPREDNISolone acetate (DEPO-MEDROL) injection 80 mg    Sig:   . albuterol (PROVENTIL HFA;VENTOLIN HFA) 108 (90 Base) MCG/ACT inhaler    Sig: Inhale 2 puffs into the lungs every 6 (six) hours as needed for wheezing or shortness of breath.    Dispense:  1 Inhaler    Refill:  0     Murtis SinkSam Bradshaw, MD [facility] 11/28/2015, 6:09 PM

## 2015-11-28 NOTE — Patient Instructions (Signed)
Great to meet you!  I am worried about the development of COPD, consider quitting smoking  Call 1-800 Quit Now  Albuterol is a "rescue" inhaler to help with wheeze, cough, or shortness of breath. After you feel better if you are taking it more than 3-4 times a week please come back, that would indicate need of more aggressive treatment  Start nasocort or flonase 2 sprays per nostril daily

## 2015-12-25 ENCOUNTER — Other Ambulatory Visit: Payer: Self-pay | Admitting: Nurse Practitioner

## 2016-04-01 ENCOUNTER — Other Ambulatory Visit: Payer: Self-pay | Admitting: Nurse Practitioner

## 2016-04-08 ENCOUNTER — Encounter: Payer: Self-pay | Admitting: Family

## 2016-04-08 ENCOUNTER — Ambulatory Visit (INDEPENDENT_AMBULATORY_CARE_PROVIDER_SITE_OTHER): Payer: 59 | Admitting: Family

## 2016-04-08 VITALS — BP 140/82 | HR 63 | Temp 98.4°F | Ht 73.0 in | Wt 249.6 lb

## 2016-04-08 DIAGNOSIS — J209 Acute bronchitis, unspecified: Secondary | ICD-10-CM

## 2016-04-08 MED ORDER — AZITHROMYCIN 250 MG PO TABS: ORAL_TABLET | ORAL | 0 refills | Status: DC

## 2016-04-08 MED ORDER — PREDNISONE 10 MG (21) PO TBPK: ORAL_TABLET | ORAL | 0 refills | Status: DC

## 2016-04-08 NOTE — Progress Notes (Signed)
   Subjective:    Patient ID: Donald ClayJonathan Edmundson, male    DOB: August 25, 1965, 50 y.o.   MRN: [id number]  URI   This is a new problem. The current episode started 1 to 4 weeks ago. The problem has been gradually worsening. There has been no fever. Associated symptoms include congestion, coughing, rhinorrhea, sinus pain and wheezing. Pertinent negatives include no diarrhea, dysuria, ear pain, nausea, plugged ear sensation, sneezing or sore throat. He has tried decongestant, acetaminophen and inhaler use (flonase) for the symptoms. The treatment provided mild relief.      Review of Systems  HENT: Positive for congestion and rhinorrhea. Negative for ear pain, sneezing and sore throat.   Respiratory: Positive for cough and wheezing.   Gastrointestinal: Negative for diarrhea and nausea.  Genitourinary: Negative for dysuria.  All other systems reviewed and are negative.      Objective:   Physical Exam  Constitutional: He is oriented to person, place, and time. He appears well-developed and well-nourished. No distress.  HENT:  Head: Normocephalic.  Right Ear: External ear normal.  Left Ear: External ear normal.  Nose: Mucosal edema and rhinorrhea present.  Mouth/Throat: Posterior oropharyngeal erythema present.  Eyes: Pupils are equal, round, and reactive to light. Right eye exhibits no discharge. Left eye exhibits no discharge.  Neck: Normal range of motion. Neck supple. No thyromegaly present.  Cardiovascular: Normal rate, regular rhythm, normal heart sounds and intact distal pulses.   No murmur heard. Pulmonary/Chest: Effort normal. No respiratory distress. He has no wheezes. He has rhonchi in the right upper field, the right lower field, the left upper field and the left lower field.  Abdominal: Soft. Bowel sounds are normal. He exhibits no distension. There is no tenderness.  Musculoskeletal: Normal range of motion. He exhibits no edema or tenderness.  Neurological: He is alert and oriented  to person, place, and time. He has normal reflexes. No cranial nerve deficit.  Skin: Skin is warm and dry. No rash noted. No erythema.  Psychiatric: He has a normal mood and affect. His behavior is normal. Judgment and thought content normal.  Vitals reviewed.     BP 140/82 (BP Location: Left Arm, Patient Position: Sitting, Cuff Size: Large)   Pulse 63   Temp 98.4 F (36.9 C) (Oral)   Ht 6\' 1"  (1.854 m)   Wt 249 lb 9.6 oz (113.2 kg)   SpO2 98%   BMI 32.93 kg/m      Assessment & Plan:  1. Acute bronchitis, unspecified organism -- Take meds as prescribed - Use a cool mist humidifier  -Use saline nose sprays frequently -Saline irrigations of the nose can be very helpful if done frequently.  * 4X daily for 1 week*  * Use of a nettie pot can be helpful with this. Follow directions with this* -Force fluids -For any cough or congestion  Use plain Mucinex- regular strength or max strength is fine   * Children- consult with Pharmacist for dosing -For fever or aces or pains- take tylenol or ibuprofen appropriate for age and weight.  * for fevers greater than 101 orally you may alternate ibuprofen and tylenol every  3 hours. -Throat lozenges if help -New toothbrush in 3 days - predniSONE (STERAPRED UNI-PAK 21 TAB) 10 MG (21) TBPK tablet; Use as directed  Dispense: 21 tablet; Refill: 0 - azithromycin (ZITHROMAX) 250 MG tablet; Take 500 mg once, then 250 mg for four days  Dispense: 6 tablet; Refill: 0  Jannifer Rodneyhristy Clanton Emanuelson, FNP

## 2016-04-08 NOTE — Patient Instructions (Signed)

## 2016-04-29 ENCOUNTER — Telehealth: Payer: Self-pay | Admitting: Family

## 2016-04-30 NOTE — Telephone Encounter (Signed)
Patient informed, [facility]01/16 [facility] 8[facility]

## 2016-04-30 NOTE — Telephone Encounter (Signed)
Patient NTBS  

## 2016-05-01 ENCOUNTER — Ambulatory Visit (INDEPENDENT_AMBULATORY_CARE_PROVIDER_SITE_OTHER): Payer: 59 | Admitting: Family

## 2016-05-01 ENCOUNTER — Encounter: Payer: Self-pay | Admitting: Family

## 2016-05-01 DIAGNOSIS — R062 Wheezing: Secondary | ICD-10-CM

## 2016-05-01 DIAGNOSIS — J209 Acute bronchitis, unspecified: Secondary | ICD-10-CM | POA: Diagnosis not present

## 2016-05-01 MED ORDER — ALBUTEROL SULFATE HFA 108 (90 BASE) MCG/ACT IN AERS: 2.0000 | INHALATION_SPRAY | Freq: Four times a day (QID) | RESPIRATORY_TRACT | 0 refills | Status: DC | PRN

## 2016-05-01 MED ORDER — PREDNISONE 10 MG (21) PO TBPK: ORAL_TABLET | ORAL | 0 refills | Status: DC

## 2016-05-01 MED ORDER — LEVOFLOXACIN 500 MG PO TABS: 500.0000 mg | ORAL_TABLET | Freq: Every day | ORAL | 0 refills | Status: DC

## 2016-05-01 NOTE — Progress Notes (Signed)
Subjective:    Patient ID: Donata ClayJonathan Kalisz, male    DOB: 1966/05/15, 50 y.o.   MRN: [id number]  Cough  The current episode started 1 to 4 weeks ago. The problem has been waxing and waning. The problem occurs every few minutes. The cough is productive of purulent sputum. Associated symptoms include ear congestion, ear pain, headaches, nasal congestion, postnasal drip, rhinorrhea, shortness of breath and wheezing. Pertinent negatives include no chills, fever, myalgias or sore throat. The symptoms are aggravated by lying down. He has tried rest, OTC cough suppressant and oral steroids for the symptoms. The treatment provided mild relief. There is no history of asthma or COPD.  Headache   Associated symptoms include coughing, ear pain and rhinorrhea. Pertinent negatives include no fever or sore throat.  Sinus Problem  Associated symptoms include coughing, ear pain, headaches and shortness of breath. Pertinent negatives include no chills or sore throat.      Review of Systems  Constitutional: Negative for chills and fever.  HENT: Positive for ear pain, postnasal drip and rhinorrhea. Negative for sore throat.   Respiratory: Positive for cough, shortness of breath and wheezing.   Musculoskeletal: Negative for myalgias.  Neurological: Positive for headaches.  All other systems reviewed and are negative.      Objective:   Physical Exam  Constitutional: He is oriented to person, place, and time. He appears well-developed and well-nourished. No distress.  HENT:  Head: Normocephalic.  Right Ear: External ear normal.  Left Ear: External ear normal.  Nasal passage erythemas with mild swelling   Eyes: Pupils are equal, round, and reactive to light. Right eye exhibits no discharge. Left eye exhibits no discharge.  Neck: Normal range of motion. Neck supple. No thyromegaly present.  Cardiovascular: Normal rate, regular rhythm, normal heart sounds and intact distal pulses.   No murmur  heard. Pulmonary/Chest: Effort normal. No respiratory distress. He has wheezes in the right upper field, the right middle field, the right lower field, the left upper field, the left middle field and the left lower field.  Abdominal: Soft. Bowel sounds are normal. He exhibits no distension. There is no tenderness.  Musculoskeletal: Normal range of motion. He exhibits no edema or tenderness.  Neurological: He is alert and oriented to person, place, and time.  Skin: Skin is warm and dry. No rash noted. No erythema.  Psychiatric: He has a normal mood and affect. His behavior is normal. Judgment and thought content normal.  Vitals reviewed.     BP (!) 142/93   Pulse 64   Temp 97.1 F (36.2 C) (Oral)   Ht 6\' 1"  (1.854 m)   Wt 247 lb 9.6 oz (112.3 kg)   BMI 32.67 kg/m      Assessment & Plan:  1. Acute bronchitis, unspecified organism -- Take meds as prescribed - Use a cool mist humidifier  -Use saline nose sprays frequently -Saline irrigations of the nose can be very helpful if done frequently.  * 4X daily for 1 week*  * Use of a nettie pot can be helpful with this. Follow directions with this* -Force fluids -For any cough or congestion  Use plain Mucinex- regular strength or max strength is fine   * Children- consult with Pharmacist for dosing -For fever or aces or pains- take tylenol or ibuprofen appropriate for age and weight.  * for fevers greater than 101 orally you may alternate ibuprofen and tylenol every  3 hours. -Throat lozenges if help - albuterol (PROVENTIL HFA;VENTOLIN HFA)  108 (90 Base) MCG/ACT inhaler; Inhale 2 puffs into the lungs every 6 (six) hours as needed for wheezing or shortness of breath.  Dispense: 1 Inhaler; Refill: 0 - levofloxacin (LEVAQUIN) 500 MG tablet; Take 1 tablet (500 mg total) by mouth daily.  Dispense: 7 tablet; Refill: 0 - predniSONE (STERAPRED UNI-PAK 21 TAB) 10 MG (21) TBPK tablet; Use as directed  Dispense: 21 tablet; Refill: 0  2. Wheeze -  albuterol (PROVENTIL HFA;VENTOLIN HFA) 108 (90 Base) MCG/ACT inhaler; Inhale 2 puffs into the lungs every 6 (six) hours as needed for wheezing or shortness of breath.  Dispense: 1 Inhaler; Refill: 0  Jannifer Rodney, FNP

## 2016-05-01 NOTE — Patient Instructions (Signed)

## 2016-05-04 ENCOUNTER — Other Ambulatory Visit: Payer: Self-pay | Admitting: Nurse Practitioner

## 2016-06-02 ENCOUNTER — Other Ambulatory Visit: Payer: Self-pay | Admitting: Family

## 2016-06-20 ENCOUNTER — Encounter: Payer: Self-pay | Admitting: Family

## 2016-06-20 ENCOUNTER — Ambulatory Visit (INDEPENDENT_AMBULATORY_CARE_PROVIDER_SITE_OTHER): Payer: 59 | Admitting: Family

## 2016-06-20 VITALS — BP 141/86 | HR 67 | Temp 98.1°F | Ht 73.0 in | Wt 251.0 lb

## 2016-06-20 DIAGNOSIS — H66001 Acute suppurative otitis media without spontaneous rupture of ear drum, right ear: Secondary | ICD-10-CM | POA: Diagnosis not present

## 2016-06-20 MED ORDER — AMOXICILLIN-POT CLAVULANATE 875-125 MG PO TABS: 1.0000 | ORAL_TABLET | Freq: Two times a day (BID) | ORAL | 0 refills | Status: DC

## 2016-06-20 NOTE — Progress Notes (Signed)
   Subjective:    Patient ID: Donald ClayJonathan Nuncio, male    DOB: 12-12-1965, 50 y.o.   MRN: [id number]  Ear Fullness   There is pain in the right ear. This is a new problem. The current episode started 1 to 4 weeks ago. The problem occurs constantly. The problem has been unchanged. There has been no fever. The patient is experiencing no pain. Associated symptoms include hearing loss and rhinorrhea. Pertinent negatives include no coughing, diarrhea, ear discharge or vomiting. Treatments tried: flonase. The treatment provided no relief.      Review of Systems  HENT: Positive for hearing loss and rhinorrhea. Negative for ear discharge.   Respiratory: Negative for cough.   Gastrointestinal: Negative for diarrhea and vomiting.  All other systems reviewed and are negative.      Objective:   Physical Exam  Constitutional: He is oriented to person, place, and time. He appears well-developed and well-nourished. No distress.  HENT:  Head: Normocephalic.  Right Ear: There is tenderness. Tympanic membrane is erythematous and bulging. A middle ear effusion is present.  Left Ear: External ear normal.  Mouth/Throat: Oropharynx is clear and moist.  Eyes: Pupils are equal, round, and reactive to light. Right eye exhibits no discharge. Left eye exhibits no discharge.  Neck: Normal range of motion. Neck supple. No thyromegaly present.  Cardiovascular: Normal rate, regular rhythm, normal heart sounds and intact distal pulses.   No murmur heard. Pulmonary/Chest: Effort normal and breath sounds normal. No respiratory distress. He has no wheezes.  Abdominal: Soft. Bowel sounds are normal. He exhibits no distension. There is no tenderness.  Musculoskeletal: Normal range of motion. He exhibits no edema or tenderness.  Neurological: He is alert and oriented to person, place, and time. He has normal reflexes. No cranial nerve deficit.  Skin: Skin is warm and dry. No rash noted. No erythema.  Psychiatric: He has a  normal mood and affect. His behavior is normal. Judgment and thought content normal.  Vitals reviewed.     BP (!) 141/86   Pulse 67   Temp 98.1 F (36.7 C) (Oral)   Ht 6\' 1"  (1.854 m)   Wt 251 lb (113.9 kg)   BMI 33.12 kg/m      Assessment & Plan:  1. Acute suppurative otitis media of right ear without spontaneous rupture of tympanic membrane, recurrence not specified -Tylenol prn for pain -Force fluids -Do not stick anything into ear RTO prn - amoxicillin-clavulanate (AUGMENTIN) 875-125 MG tablet; Take 1 tablet by mouth 2 (two) times daily.  Dispense: 14 tablet; Refill: 0  Jannifer Rodneyhristy Ripley Bogosian, FNP

## 2016-06-20 NOTE — Patient Instructions (Signed)
Otitis Media, Adult Otitis media is redness, soreness, and puffiness (swelling) in the space just behind your eardrum (middle ear). It may be caused by allergies or infection. It often happens along with a cold. Follow these instructions at home:  Take your medicine as told. Finish it even if you start to feel better.  Only take over-the-counter or prescription medicines for pain, discomfort, or fever as told by your doctor.  Follow up with your doctor as told. Contact a doctor if:  You have otitis media only in one ear, or bleeding from your nose, or both.  You notice a lump on your neck.  You are not getting better in 3-5 days.  You feel worse instead of better. Get help right away if:  You have pain that is not helped with medicine.  You have puffiness, redness, or pain around your ear.  You get a stiff neck.  You cannot move part of your face (paralysis).  You notice that the bone behind your ear hurts when you touch it. This information is not intended to replace advice given to you by your health care provider. Make sure you discuss any questions you have with your health care provider. Document Released: 12/11/2007 Document Revised: 11/30/2015 Document Reviewed: 01/19/2013 Elsevier Interactive Patient Education  2017 Elsevier Inc.  

## 2016-06-28 ENCOUNTER — Telehealth: Payer: Self-pay | Admitting: Nurse Practitioner

## 2016-06-28 ENCOUNTER — Other Ambulatory Visit: Payer: Self-pay | Admitting: *Deleted

## 2016-06-28 MED ORDER — PREDNISONE 20 MG PO TABS: 20.0000 mg | ORAL_TABLET | Freq: Two times a day (BID) | ORAL | 0 refills | Status: DC

## 2016-06-28 NOTE — Telephone Encounter (Signed)
She gave him Augmentin which is a very get antibiotic and should cover most things, if his ear is still hurting then he likely needs to be seen. Another thing that he could use his Flonase and I guess we could also send him a short course of prednisone 40 mg daily for 5 days

## 2016-06-28 NOTE — Telephone Encounter (Signed)
Saw [name] 12/14 and ear is no better and is still hurting.  Can something else be called in.  It is really bothering him.  Please call.

## 2016-06-28 NOTE — Telephone Encounter (Signed)
Pt notified of recommendation Verbalizes understanding 

## 2016-09-07 ENCOUNTER — Other Ambulatory Visit: Payer: Self-pay | Admitting: Family

## 2016-10-17 ENCOUNTER — Other Ambulatory Visit: Payer: Self-pay | Admitting: *Deleted

## 2016-10-17 ENCOUNTER — Other Ambulatory Visit: Payer: Self-pay | Admitting: Family

## 2016-10-17 MED ORDER — ESCITALOPRAM OXALATE 20 MG PO TABS: ORAL_TABLET | ORAL | 2 refills | Status: DC

## 2016-11-14 ENCOUNTER — Encounter: Payer: Self-pay | Admitting: Pediatrics

## 2016-11-14 ENCOUNTER — Ambulatory Visit (INDEPENDENT_AMBULATORY_CARE_PROVIDER_SITE_OTHER): Payer: 59 | Admitting: Pediatrics

## 2016-11-14 VITALS — BP 143/89 | HR 66 | Temp 97.7°F | Ht 73.0 in | Wt 250.0 lb

## 2016-11-14 DIAGNOSIS — M542 Cervicalgia: Secondary | ICD-10-CM

## 2016-11-14 DIAGNOSIS — R030 Elevated blood-pressure reading, without diagnosis of hypertension: Secondary | ICD-10-CM

## 2016-11-14 DIAGNOSIS — T148XXA Other injury of unspecified body region, initial encounter: Secondary | ICD-10-CM

## 2016-11-14 MED ORDER — CYCLOBENZAPRINE HCL 5 MG PO TABS: 5.0000 mg | ORAL_TABLET | Freq: Three times a day (TID) | ORAL | 0 refills | Status: DC | PRN

## 2016-11-14 MED ORDER — NAPROXEN 500 MG PO TABS: 500.0000 mg | ORAL_TABLET | Freq: Two times a day (BID) | ORAL | 0 refills | Status: DC | PRN

## 2016-11-14 NOTE — Patient Instructions (Signed)
Check blood pressures at home, goal blood pressure 120s/70s

## 2016-11-14 NOTE — Progress Notes (Addendum)
  Subjective:   Patient ID: Donata ClayJonathan Hovland, male    DOB: Apr 28, 1966, 51 y.o.   MRN: [id number] CC: Neck Pain  HPI: Donata ClayJonathan Iannacone is a 51 y.o. male presenting for Neck Pain  Started after a day of lifting stepping stones 4 days ago Had a headache earlier that day shoulder has been dull aching since then Pints to back of his head to upper shoulder with where it hurts Can pinpoint one place soft tissue over shoulder where it hurts the most Can feel it stretching when he tilts his head No headaches for the past week Normal strength, sensation throughout No vision changes, blurry or double vision  Says he was told he has high blood pressure in the past but it improved with treatment of anxiety/depression with lexapro  Relevant past medical, surgical, family and social history reviewed. Allergies and medications reviewed and updated. History  Smoking Status  . Current Every Day Smoker  . Packs/day: 1.00  . Types: Cigarettes  Smokeless Tobacco  . Never Used   ROS: Per HPI   Objective:    BP (!) 143/89   Pulse 66   Temp 97.7 F (36.5 C) (Oral)   Ht 6\' 1"  (1.854 m)   Wt 250 lb (113.4 kg)   BMI 32.98 kg/m   Wt Readings from Last 3 Encounters:  11/14/16 250 lb (113.4 kg)  06/20/16 251 lb (113.9 kg)  05/01/16 247 lb 9.6 oz (112.3 kg)    Gen: NAD, alert, cooperative with exam, NCAT EYES: no conjunctival injection, or no icterus LYMPH: no cervical LAD CV: NRRR, normal S1/S2, no murmur, distal pulses 2+ b/l Resp: CTABL, no wheezes, normal WOB Ext: No edema, warm Neuro: Alert and oriented, strength equal b/l UE and LE, coordination grossly normal MSK:  Shoulders appear symmetric no point tenderness over spine ttp soft tissue top of L shoulder Normal neck ROM with turning/tilting b/l Does feel L side of neck tightening when he tilts head to R shoulder   Assessment & Plan:  Christiane HaJonathan was seen today for neck pain.  Diagnoses and all orders for this visit:  Muscle  strain Discussed gentle stretching, rest, ice, NSAIDs -     naproxen (NAPROSYN) 500 MG tablet; Take 1 tablet (500 mg total) by mouth 2 (two) times daily as needed. -     cyclobenzaprine (FLEXERIL) 5 MG tablet; Take 1 tablet (5 mg total) by mouth 3 (three) times daily as needed for muscle spasms.  Elevated blood pressure reading BPs persistently elevated in office visits Usually comes in for acute visits, has not had a physical in years No blood work in system Will check BMP today as starting on NSAIDs as above BP improved with recheck Will check BP at home over next few weeks, goal 120s/70s or less rtc for physical 3-4 weeks  Follow up plan: Return in about 4 weeks (around 12/12/2016) for physical. Rex Krasarol Jeniyah Menor, MD [facility]

## 2016-11-15 LAB — BASIC METABOLIC PANEL
BUN/Creatinine Ratio: 9 (ref 9–20)
BUN: 7 mg/dL (ref 6–24)
CO2: 27 mmol/L (ref 18–29)
Calcium: 9.4 mg/dL (ref 8.7–10.2)
Chloride: 97 mmol/L (ref 96–106)
Creatinine, Ser: 0.76 mg/dL (ref 0.76–1.27)
GFR, EST AFRICAN AMERICAN: 124 mL/min/{1.73_m2} (ref >59)
GFR, EST NON AFRICAN AMERICAN: 107 mL/min/{1.73_m2} (ref >59)
Glucose: 93 mg/dL (ref 65–99)
POTASSIUM: 4.4 mmol/L (ref 3.5–5.2)
SODIUM: 138 mmol/L (ref 134–144)

## 2016-11-19 ENCOUNTER — Encounter: Payer: Self-pay | Admitting: Family Medicine

## 2016-11-19 ENCOUNTER — Ambulatory Visit (INDEPENDENT_AMBULATORY_CARE_PROVIDER_SITE_OTHER): Payer: 59 | Admitting: Family Medicine

## 2016-11-19 VITALS — BP 160/100 | HR 58 | Temp 97.9°F | Ht 73.0 in | Wt 248.4 lb

## 2016-11-19 DIAGNOSIS — R030 Elevated blood-pressure reading, without diagnosis of hypertension: Secondary | ICD-10-CM

## 2016-11-19 DIAGNOSIS — Z0000 Encounter for general adult medical examination without abnormal findings: Secondary | ICD-10-CM

## 2016-11-19 DIAGNOSIS — M542 Cervicalgia: Secondary | ICD-10-CM

## 2016-11-19 DIAGNOSIS — E669 Obesity, unspecified: Secondary | ICD-10-CM

## 2016-11-19 MED ORDER — PREDNISONE 20 MG PO TABS: ORAL_TABLET | ORAL | 0 refills | Status: DC

## 2016-11-19 MED ORDER — OLMESARTAN MEDOXOMIL-HCTZ 20-12.5 MG PO TABS: 1.0000 | ORAL_TABLET | Freq: Every day | ORAL | 3 refills | Status: DC

## 2016-11-19 NOTE — Progress Notes (Signed)
   HPI  Patient presents today here for annual physical exam  Patient has had elevated blood pressure last week when he was seen for neck pain. He states at home it still been 150s and 60s over 90s mostly. No chest pain, dyspnea, palpitations. He has had some leg edema.  Neck pain is persistent, left-sided neck pain with radiation down to the arm. No arm weakness. Naprosyn is helping somewhat, Flexeril has not been tried.  Patient is not exercising routinely, he is motivated to change that and is coming up with a plan   PMH: Smoking status noted ROS: Per HPI  Objective: BP (!) 160/100   Pulse (!) 58   Temp 97.9 F (36.6 C) (Oral)   Ht _0  (1.854 m)   Wt 248 lb 6.4 oz (112.7 kg)   BMI 32.77 kg/m  Gen: NAD, alert, cooperative with exam HEENT: NCAT, EOMI, PERRL, left eye is slightly down and laterally displaced on forward gaze, oropharynx clear, TMs normal bilaterally CV: RRR, good S1/S2, no murmur Resp: CTABL, no wheezes, non-labored Abd: SNTND, BS present, no guarding or organomegaly Ext: No edema, warm Neuro: Alert and oriented, No gross deficits  Assessment and plan:  # Annual physical exam Normal exam except for obesity and blood pressure which were discussed. Rickman in therapeutic lifestyle changes with reducing salt in the diet, losing weight, increasing regular physical activity  # Neck pain Radicular neck pain, helped minimally by NSAIDs, we'll go ahead and prescribe short course of steroids. As discussed with previous provider this could be causing some of his elevated blood pressure. I recommended starting medication if blood pressure does not normalize (defined as less than 140/80) after pain is improved.  # Elevated blood pressure Likely new diagnosis of hypertension Start olmesartan/HCTZ, and normal previously, repeat labs in 4-6 weeks just before follow-up. Patient will start medication next week if blood pressure is not normalized after neck pain has  completely improved. He understands this plan well   Orders Placed This Encounter  Procedures  . CBC with Differential/Platelet    Standing Status:   Future    Standing Expiration Date:   11/19/2017  . CMP14+EGFR    Standing Status:   Future    Standing Expiration Date:   11/19/2017  . Lipid panel    Standing Status:   Future    Standing Expiration Date:   11/19/2017  . TSH    Standing Status:   Future    Standing Expiration Date:   11/19/2017    Meds ordered this encounter  Medications  . predniSONE (DELTASONE) 20 MG tablet    Sig: 2 po at sametime daily for 5 days- start tomorrow    Dispense:  10 tablet    Refill:  0  . olmesartan-hydrochlorothiazide (BENICAR HCT) 20-12.5 MG tablet    Sig: Take 1 tablet by mouth daily.    Dispense:  30 tablet    Refill:  [place], MD [place] 11/19/2016, 2:55 PM

## 2016-11-19 NOTE — Patient Instructions (Addendum)
Great to see you!  For the neck pain:  Start the prednisone tomorrow morning with food.  After the course is over and the neck pain is better, Continue checking your BP.  If your BP is over 140/90 consistently then start olmesartan/HCTZ  Come back in 4-6 weeks for follow up, come in for a lab appointment about 1 week before to get your labs fasting

## 2016-12-17 ENCOUNTER — Other Ambulatory Visit: Payer: 59

## 2016-12-17 DIAGNOSIS — M542 Cervicalgia: Secondary | ICD-10-CM

## 2016-12-17 DIAGNOSIS — E669 Obesity, unspecified: Secondary | ICD-10-CM

## 2016-12-17 DIAGNOSIS — R030 Elevated blood-pressure reading, without diagnosis of hypertension: Secondary | ICD-10-CM

## 2016-12-18 LAB — CMP14+EGFR
ALT: 27 IU/L (ref 0–44)
AST: 21 IU/L (ref 0–40)
Albumin/Globulin Ratio: 1.8 (ref 1.2–2.2)
Albumin: 4.3 g/dL (ref 3.5–5.5)
Alkaline Phosphatase: 75 IU/L (ref 39–117)
BILIRUBIN TOTAL: 0.4 mg/dL (ref 0.0–1.2)
BUN/Creatinine Ratio: 18 (ref 9–20)
BUN: 15 mg/dL (ref 6–24)
CALCIUM: 9.6 mg/dL (ref 8.7–10.2)
CHLORIDE: 95 mmol/L — AB (ref 96–106)
CO2: 26 mmol/L (ref 20–29)
Creatinine, Ser: 0.85 mg/dL (ref 0.76–1.27)
GFR calc non Af Amer: 102 mL/min/{1.73_m2} (ref >59)
GFR, EST AFRICAN AMERICAN: 117 mL/min/{1.73_m2} (ref >59)
GLUCOSE: 95 mg/dL (ref 65–99)
Globulin, Total: 2.4 g/dL (ref 1.5–4.5)
Potassium: 4 mmol/L (ref 3.5–5.2)
Sodium: 138 mmol/L (ref 134–144)
TOTAL PROTEIN: 6.7 g/dL (ref 6.0–8.5)

## 2016-12-18 LAB — CBC WITH DIFFERENTIAL/PLATELET
BASOS: 0 %
Basophils Absolute: 0 10*3/uL (ref 0.0–0.2)
EOS (ABSOLUTE): 0.3 10*3/uL (ref 0.0–0.4)
EOS: 4 %
HEMATOCRIT: 43.6 % (ref 37.5–51.0)
HEMOGLOBIN: 15 g/dL (ref 13.0–17.7)
Immature Grans (Abs): 0 10*3/uL (ref 0.0–0.1)
Immature Granulocytes: 0 %
LYMPHS ABS: 2.2 10*3/uL (ref 0.7–3.1)
Lymphs: 29 %
MCH: 30.4 pg (ref 26.6–33.0)
MCHC: 34.4 g/dL (ref 31.5–35.7)
MCV: 88 fL (ref 79–97)
MONOCYTES: 9 %
MONOS ABS: 0.6 10*3/uL (ref 0.1–0.9)
NEUTROS ABS: 4.3 10*3/uL (ref 1.4–7.0)
Neutrophils: 58 %
Platelets: 222 10*3/uL (ref 150–379)
RBC: 4.93 x10E6/uL (ref 4.14–5.80)
RDW: 12.8 % (ref 12.3–15.4)
WBC: 7.3 10*3/uL (ref 3.4–10.8)

## 2016-12-18 LAB — LIPID PANEL
Chol/HDL Ratio: 5.1 ratio — ABNORMAL HIGH (ref 0.0–5.0)
Cholesterol, Total: 187 mg/dL (ref 100–199)
HDL: 37 mg/dL — ABNORMAL LOW (ref >39)
LDL Calculated: 107 mg/dL — ABNORMAL HIGH (ref 0–99)
Triglycerides: 217 mg/dL — ABNORMAL HIGH (ref 0–149)
VLDL CHOLESTEROL CAL: 43 mg/dL — AB (ref 5–40)

## 2016-12-18 LAB — TSH: TSH: 2.38 u[IU]/mL (ref 0.450–4.500)

## 2016-12-24 ENCOUNTER — Ambulatory Visit (INDEPENDENT_AMBULATORY_CARE_PROVIDER_SITE_OTHER): Payer: 59 | Admitting: Family Medicine

## 2016-12-24 ENCOUNTER — Encounter: Payer: Self-pay | Admitting: Family Medicine

## 2016-12-24 VITALS — BP 110/66 | HR 79 | Temp 97.3°F | Ht 73.0 in | Wt 243.0 lb

## 2016-12-24 DIAGNOSIS — I10 Essential (primary) hypertension: Secondary | ICD-10-CM | POA: Diagnosis not present

## 2016-12-24 DIAGNOSIS — N529 Male erectile dysfunction, unspecified: Secondary | ICD-10-CM | POA: Diagnosis not present

## 2016-12-24 DIAGNOSIS — Z1211 Encounter for screening for malignant neoplasm of colon: Secondary | ICD-10-CM

## 2016-12-24 MED ORDER — SILDENAFIL CITRATE 20 MG PO TABS: ORAL_TABLET | ORAL | 5 refills | Status: DC

## 2016-12-24 NOTE — Patient Instructions (Addendum)
Great to see you!  Come back in 6 months unless you need us sooner.   Try sildinafil, it is not covered by insurance for use like this but is available in town for about 1 dollar a pill.

## 2016-12-24 NOTE — Progress Notes (Signed)
   HPI  Patient presents today here to follow-up for hypertension.  Hypertension Started on olmesartan/HCTZ last visit, tolerating well. No dizziness Blood pressure 110-120 systolic. No side effects.  Erectile dysfunction, long-standing, previously had headache with Crestor, would like to try something else if it's affordable.  Screening for colon cancer Patient is interested in a colonoscopy  PMH: Smoking status noted ROS: Per HPI  Objective: BP 110/66   Pulse 79   Temp 97.3 F (36.3 C) (Oral)   Ht 6\' 1"  (1.854 m)   Wt 243 lb (110.2 kg)   BMI 32.06 kg/m  Gen: NAD, alert, cooperative with exam HEENT: NCAT CV: RRR, good S1/S2, no murmur Resp: CTABL, no wheezes, non-labored Ext: No edema, warm Neuro: Alert and oriented, No gross deficits  Assessment and plan:  # Hypertension Well-controlled on olmesartan/HCTZ Repeat labs look good, no change in creatinine  # Erectile dysfunction Long-standing problem, patient cannot afford brand name Viagra or Cialis Trial of generic sildenafil  # Treating for colon cancer Referred for colonoscopy, discussed pros and cons of screening, he agrees      Orders Placed This Encounter  Procedures  . Ambulatory referral to Gastroenterology    Referral Priority:   Routine    Referral Type:   Consultation    Referral Reason:   Specialty Services Required    Number of Visits Requested:   1    Meds ordered this encounter  Medications  . sildenafil (REVATIO) 20 MG tablet    Sig: 2 to 5 tabs once daily as needed for erectile dysfunction    Dispense:  30 tablet    Refill:  5    Murtis SinkSam Drake [place], MD [facility] 12/24/2016, 4:07 PM

## 2016-12-30 ENCOUNTER — Other Ambulatory Visit: Payer: Self-pay

## 2016-12-30 MED ORDER — OLMESARTAN MEDOXOMIL-HCTZ 20-12.5 MG PO TABS: 1.0000 | ORAL_TABLET | Freq: Every day | ORAL | 1 refills | Status: DC

## 2017-01-13 ENCOUNTER — Other Ambulatory Visit: Payer: Self-pay | Admitting: Family

## 2017-01-24 ENCOUNTER — Encounter: Payer: Self-pay | Admitting: Family Medicine

## 2017-03-21 ENCOUNTER — Other Ambulatory Visit: Payer: Self-pay

## 2017-03-21 MED ORDER — ESCITALOPRAM OXALATE 20 MG PO TABS: ORAL_TABLET | ORAL | 0 refills | Status: DC

## 2017-04-28 ENCOUNTER — Encounter: Payer: Self-pay | Admitting: Physician Assistant

## 2017-04-28 ENCOUNTER — Ambulatory Visit (INDEPENDENT_AMBULATORY_CARE_PROVIDER_SITE_OTHER): Payer: 59 | Admitting: Physician Assistant

## 2017-04-28 DIAGNOSIS — J209 Acute bronchitis, unspecified: Secondary | ICD-10-CM

## 2017-04-28 DIAGNOSIS — R062 Wheezing: Secondary | ICD-10-CM | POA: Diagnosis not present

## 2017-04-28 MED ORDER — CEFDINIR 300 MG PO CAPS: 300.0000 mg | ORAL_CAPSULE | Freq: Two times a day (BID) | ORAL | 0 refills | Status: DC

## 2017-04-28 MED ORDER — ALBUTEROL SULFATE HFA 108 (90 BASE) MCG/ACT IN AERS: 2.0000 | INHALATION_SPRAY | Freq: Four times a day (QID) | RESPIRATORY_TRACT | 1 refills | Status: DC | PRN

## 2017-04-28 MED ORDER — PREDNISONE 10 MG (48) PO TBPK
ORAL_TABLET | ORAL | 0 refills | Status: DC
Start: 2017-04-28 — End: 2018-03-26

## 2017-04-28 MED ORDER — FLUTICASONE PROPIONATE 50 MCG/ACT NA SUSP: 2.0000 | Freq: Every day | NASAL | 11 refills | Status: DC

## 2017-04-28 NOTE — Progress Notes (Signed)
BP 115/76   Pulse 69   Temp 98.4 F (36.9 C) (Oral)   Ht 6\' 1"  (1.854 m)   Wt 248 lb (112.5 kg)   BMI 32.72 kg/m    Subjective:    Patient ID: Donald Reeves, male    DOB: 01-23-1966, 51 y.o.   MRN: [id number]  HPI: Donald Reeves is a 51 y.o. male presenting on 04/28/2017 for head congestion and Sinusitis  .This patient has had many days of sinus headache and postnasal drainage. There is copious drainage at times. Denies any fever at this time. There has been a history of sinus infections in the past.  No history of sinus surgery. There is cough at night. It has become more prevalent in recent days.  Relevant past medical, surgical, family and social history reviewed and updated as indicated. Allergies and medications reviewed and updated.  Past Medical History:  Diagnosis Date  . Anxiety     Past Surgical History:  Procedure Laterality Date  . EYE SURGERY    . HERNIA REPAIR      Review of Systems  Constitutional: Positive for fatigue. Negative for appetite change.  HENT: Positive for sinus pressure and sore throat.   Eyes: Negative.  Negative for pain and visual disturbance.  Respiratory: Positive for shortness of breath and wheezing. Negative for cough and chest tightness.   Cardiovascular: Negative.  Negative for chest pain, palpitations and leg swelling.  Gastrointestinal: Negative.  Negative for abdominal pain, diarrhea, nausea and vomiting.  Endocrine: Negative.   Genitourinary: Negative.   Musculoskeletal: Positive for back pain and myalgias.  Skin: Negative.  Negative for color change and rash.  Neurological: Positive for headaches. Negative for weakness and numbness.  Psychiatric/Behavioral: Negative.     Allergies as of 04/28/2017   No Known Allergies     Medication List       Accurate as of 04/28/17  3:08 PM. Always use your most recent med list.          albuterol 108 (90 Base) MCG/ACT inhaler Commonly known as:  PROVENTIL HFA;VENTOLIN  HFA Inhale 2 puffs into the lungs every 6 (six) hours as needed for wheezing or shortness of breath.   cefdinir 300 MG capsule Commonly known as:  OMNICEF Take 1 capsule (300 mg total) by mouth 2 (two) times daily. 1 po BID   cyclobenzaprine 5 MG tablet Commonly known as:  FLEXERIL Take 1 tablet (5 mg total) by mouth 3 (three) times daily as needed for muscle spasms.   escitalopram 20 MG tablet Commonly known as:  LEXAPRO TAKE 1 TABLET (20 MG TOTAL) BY MOUTH DAILY.   fluticasone 50 MCG/ACT nasal spray Commonly known as:  FLONASE Place 2 sprays into both nostrils daily.   naproxen 500 MG tablet Commonly known as:  NAPROSYN Take 1 tablet (500 mg total) by mouth 2 (two) times daily as needed.   olmesartan-hydrochlorothiazide 20-12.5 MG tablet Commonly known as:  BENICAR HCT Take 1 tablet by mouth daily.   predniSONE 10 MG (48) Tbpk tablet Commonly known as:  STERAPRED UNI-PAK 48 TAB Take as directed   sildenafil 20 MG tablet Commonly known as:  REVATIO 2 to 5 tabs once daily as needed for erectile dysfunction          Objective:    BP 115/76   Pulse 69   Temp 98.4 F (36.9 C) (Oral)   Ht 6\' 1"  (1.854 m)   Wt 248 lb (112.5 kg)   BMI 32.72 kg/m  No Known Allergies  Physical Exam  Constitutional: He is oriented to person, place, and time. He appears well-developed and well-nourished.  HENT:  Head: Normocephalic and atraumatic.  Right Ear: Tympanic membrane and external ear normal. No middle ear effusion.  Left Ear: Tympanic membrane and external ear normal.  No middle ear effusion.  Nose: Mucosal edema and rhinorrhea present. Right sinus exhibits no maxillary sinus tenderness. Left sinus exhibits no maxillary sinus tenderness.  Mouth/Throat: Uvula is midline. Posterior oropharyngeal erythema present.  Eyes: Pupils are equal, round, and reactive to light. Conjunctivae and EOM are normal. Right eye exhibits no discharge. Left eye exhibits no discharge.  Neck:  Normal range of motion.  Cardiovascular: Normal rate, regular rhythm and normal heart sounds.   Pulmonary/Chest: Effort normal and breath sounds normal. No respiratory distress. He has no wheezes.  Abdominal: Soft.  Lymphadenopathy:    He has no cervical adenopathy.  Neurological: He is alert and oriented to person, place, and time.  Skin: Skin is warm and dry.  Psychiatric: He has a normal mood and affect.        Assessment & Plan:   1. Acute bronchitis, unspecified organism  - albuterol (PROVENTIL HFA;VENTOLIN HFA) 108 (90 Base) MCG/ACT inhaler; Inhale 2 puffs into the lungs every 6 (six) hours as needed for wheezing or shortness of breath.  Dispense: 1 Inhaler; Refill: 1  2. Wheeze - albuterol (PROVENTIL HFA;VENTOLIN HFA) 108 (90 Base) MCG/ACT inhaler; Inhale 2 puffs into the lungs every 6 (six) hours as needed for wheezing or shortness of breath.  Dispense: 1 Inhaler; Refill: 1    Current Outpatient Prescriptions:  .  albuterol (PROVENTIL HFA;VENTOLIN HFA) 108 (90 Base) MCG/ACT inhaler, Inhale 2 puffs into the lungs every 6 (six) hours as needed for wheezing or shortness of breath., Disp: 1 Inhaler, Rfl: 1 .  cyclobenzaprine (FLEXERIL) 5 MG tablet, Take 1 tablet (5 mg total) by mouth 3 (three) times daily as needed for muscle spasms., Disp: 30 tablet, Rfl: 0 .  escitalopram (LEXAPRO) 20 MG tablet, TAKE 1 TABLET (20 MG TOTAL) BY MOUTH DAILY., Disp: 30 tablet, Rfl: 2 .  naproxen (NAPROSYN) 500 MG tablet, Take 1 tablet (500 mg total) by mouth 2 (two) times daily as needed., Disp: 30 tablet, Rfl: 0 .  olmesartan-hydrochlorothiazide (BENICAR HCT) 20-12.5 MG tablet, Take 1 tablet by mouth daily., Disp: 90 tablet, Rfl: 1 .  sildenafil (REVATIO) 20 MG tablet, 2 to 5 tabs once daily as needed for erectile dysfunction, Disp: 30 tablet, Rfl: 5 .  cefdinir (OMNICEF) 300 MG capsule, Take 1 capsule (300 mg total) by mouth 2 (two) times daily. 1 po BID, Disp: 20 capsule, Rfl: 0 .  fluticasone  (FLONASE) 50 MCG/ACT nasal spray, Place 2 sprays into both nostrils daily., Disp: 16 g, Rfl: 11 .  predniSONE (STERAPRED UNI-PAK 48 TAB) 10 MG (48) TBPK tablet, Take as directed, Disp: 48 tablet, Rfl: 0 Continue all other maintenance medications as listed above.  Follow up plan: Return if symptoms worsen or fail to improve.  Educational handout given for survey  Remus LofflerAngel S. Britany Callicott PA-C [facility] [address]  [place], [place] [address] [phone]   04/28/2017, 3:08 PM

## 2017-04-28 NOTE — Patient Instructions (Signed)
In a few days you may receive a survey in the mail or online from [employer] regarding your visit with us today. Please take a moment to fill this out. Your feedback is very important to our whole office. It can help us better understand your needs as well as improve your experience and satisfaction. Thank you for taking your time to complete it. We care about you.  Jackie Littlejohn, PA-C  

## 2017-06-21 ENCOUNTER — Other Ambulatory Visit: Payer: Self-pay | Admitting: Family

## 2017-07-03 ENCOUNTER — Other Ambulatory Visit: Payer: Self-pay | Admitting: *Deleted

## 2017-07-03 MED ORDER — OLMESARTAN MEDOXOMIL-HCTZ 20-12.5 MG PO TABS: 1.0000 | ORAL_TABLET | Freq: Every day | ORAL | 1 refills | Status: DC

## 2017-09-02 ENCOUNTER — Other Ambulatory Visit: Payer: Self-pay | Admitting: *Deleted

## 2017-09-02 MED ORDER — ESCITALOPRAM OXALATE 20 MG PO TABS: ORAL_TABLET | ORAL | 0 refills | Status: DC

## 2017-11-25 ENCOUNTER — Other Ambulatory Visit: Payer: Self-pay | Admitting: Nurse Practitioner

## 2018-02-23 ENCOUNTER — Other Ambulatory Visit: Payer: Self-pay | Admitting: Nurse Practitioner

## 2018-02-25 ENCOUNTER — Other Ambulatory Visit: Payer: Self-pay | Admitting: *Deleted

## 2018-02-25 MED ORDER — OLMESARTAN MEDOXOMIL-HCTZ 20-12.5 MG PO TABS: 1.0000 | ORAL_TABLET | Freq: Every day | ORAL | 0 refills | Status: DC

## 2018-03-19 ENCOUNTER — Other Ambulatory Visit: Payer: Self-pay | Admitting: Nurse Practitioner

## 2018-03-19 NOTE — Telephone Encounter (Signed)
Last seen 04/28/17  MMM  Needs to be seen

## 2018-03-21 ENCOUNTER — Other Ambulatory Visit: Payer: Self-pay | Admitting: Nurse Practitioner

## 2018-03-23 NOTE — Telephone Encounter (Signed)
[facility]28/17  [facility]   Needs [facility] be seen

## 2018-03-23 NOTE — Telephone Encounter (Signed)
appt made

## 2018-03-24 ENCOUNTER — Other Ambulatory Visit: Payer: Self-pay | Admitting: Physician Assistant

## 2018-03-24 DIAGNOSIS — J209 Acute bronchitis, unspecified: Secondary | ICD-10-CM

## 2018-03-24 DIAGNOSIS — R062 Wheezing: Secondary | ICD-10-CM

## 2018-03-25 NOTE — Telephone Encounter (Signed)
Ov 03/26/18 

## 2018-03-26 ENCOUNTER — Ambulatory Visit: Payer: 59 | Admitting: Nurse Practitioner

## 2018-03-26 ENCOUNTER — Encounter: Payer: Self-pay | Admitting: Nurse Practitioner

## 2018-03-26 VITALS — BP 104/67 | HR 73 | Temp 96.9°F | Ht 73.0 in | Wt 251.0 lb

## 2018-03-26 DIAGNOSIS — I10 Essential (primary) hypertension: Secondary | ICD-10-CM | POA: Diagnosis not present

## 2018-03-26 DIAGNOSIS — Z23 Encounter for immunization: Secondary | ICD-10-CM | POA: Diagnosis not present

## 2018-03-26 DIAGNOSIS — F410 Panic disorder [episodic paroxysmal anxiety] without agoraphobia: Secondary | ICD-10-CM | POA: Diagnosis not present

## 2018-03-26 DIAGNOSIS — F411 Generalized anxiety disorder: Secondary | ICD-10-CM

## 2018-03-26 DIAGNOSIS — Z125 Encounter for screening for malignant neoplasm of prostate: Secondary | ICD-10-CM

## 2018-03-26 DIAGNOSIS — F17200 Nicotine dependence, unspecified, uncomplicated: Secondary | ICD-10-CM | POA: Insufficient documentation

## 2018-03-26 DIAGNOSIS — Z1211 Encounter for screening for malignant neoplasm of colon: Secondary | ICD-10-CM

## 2018-03-26 DIAGNOSIS — Z1212 Encounter for screening for malignant neoplasm of rectum: Secondary | ICD-10-CM

## 2018-03-26 MED ORDER — OLMESARTAN MEDOXOMIL-HCTZ 20-12.5 MG PO TABS: 1.0000 | ORAL_TABLET | Freq: Every day | ORAL | 1 refills | Status: DC

## 2018-03-26 MED ORDER — ESCITALOPRAM OXALATE 20 MG PO TABS: 20.0000 mg | ORAL_TABLET | Freq: Every day | ORAL | 1 refills | Status: DC

## 2018-03-26 NOTE — Patient Instructions (Signed)
Edema Edema is when you have too much fluid in your body or under your skin. Edema may make your legs, feet, and ankles swell up. Swelling is also common in looser tissues, like around your eyes. This is a common condition. It gets more common as you get older. There are many possible causes of edema. Eating too much salt (sodium) and being on your feet or sitting for a long time can cause edema in your legs, feet, and ankles. Hot weather may make edema worse. Edema is usually painless. Your skin may look swollen or shiny. Follow these instructions at home:  Keep the swollen body part raised (elevated) above the level of your heart when you are sitting or lying down.  Do not sit still or stand for a long time.  Do not wear tight clothes. Do not wear garters on your upper legs.  Exercise your legs. This can help the swelling go down.  Wear elastic bandages or support stockings as told by your doctor.  Eat a low-salt (low-sodium) diet to reduce fluid as told by your doctor.  Depending on the cause of your swelling, you may need to limit how much fluid you drink (fluid restriction).  Take over-the-counter and prescription medicines only as told by your doctor. Contact a doctor if:  Treatment is not working.  You have heart, liver, or kidney disease and have symptoms of edema.  You have sudden and unexplained weight gain. Get help right away if:  You have shortness of breath or chest pain.  You cannot breathe when you lie down.  You have pain, redness, or warmth in the swollen areas.  You have heart, liver, or kidney disease and get edema all of a sudden.  You have a fever and your symptoms get worse all of a sudden. Summary  Edema is when you have too much fluid in your body or under your skin.  Edema may make your legs, feet, and ankles swell up. Swelling is also common in looser tissues, like around your eyes.  Raise (elevate) the swollen body part above the level of your  heart when you are sitting or lying down.  Follow your doctor's instructions about diet and how much fluid you can drink (fluid restriction). This information is not intended to replace advice given to you by your health care provider. Make sure you discuss any questions you have with your health care provider. Document Released: 12/11/2007 Document Revised: 07/12/2016 Document Reviewed: 07/12/2016 Elsevier Interactive Patient Education  2017 Elsevier Inc.  

## 2018-03-26 NOTE — Addendum Note (Signed)
Addended by: Cleda DaubUCKER, AMANDA G on: 03/26/2018 05:06 PM   Modules accepted: Orders

## 2018-03-26 NOTE — Progress Notes (Signed)
Subjective:    Patient ID: Donald Reeves, male    DOB: 1966/01/28, 52 y.o.   MRN: [id number]   Chief Complaint: Medical Management of Chronic Issues   HPI:  1. Essential hypertension  -does check BP at home -low 100s/60-70s -tries to watch diet, does not add table salt to meals -no HA/CP/SOB  2. Panic attacks  -controlled on Lexapro -has RX for xanax but does not take it   3. GAD (generalized anxiety disorder)  -symptoms on the Lexapro   4.      Smoker          1 pak per day for at least 30 years- has tried chantix in the past but           gave him crazy dreams.  Outpatient Encounter Medications as of 03/26/2018  Medication Sig  . albuterol (PROVENTIL HFA;VENTOLIN HFA) 108 (90 Base) MCG/ACT inhaler INHALE 2 PUFFS BY MOUTH EVERY 6 HOURS AS NEEDED FOR WHEEZE OR SHORTNESS OF BREATH  . escitalopram (LEXAPRO) 20 MG tablet TAKE 1 TABLET BY MOUTH EVERY DAY  . fluticasone (FLONASE) 50 MCG/ACT nasal spray Place 2 sprays into both nostrils daily.  Marland Kitchen olmesartan-hydrochlorothiazide (BENICAR HCT) 20-12.5 MG tablet Take 1 tablet by mouth daily. Needs to be seen before next refill  . sildenafil (REVATIO) 20 MG tablet 2 to 5 tabs once daily as needed for erectile dysfunction  . cyclobenzaprine (FLEXERIL) 5 MG tablet Take 1 tablet (5 mg total) by mouth 3 (three) times daily as needed for muscle spasms. (Patient not taking: Reported on 03/26/2018)  . naproxen (NAPROSYN) 500 MG tablet Take 1 tablet (500 mg total) by mouth 2 (two) times daily as needed. (Patient not taking: Reported on 03/26/2018)  . [DISCONTINUED] cefdinir (OMNICEF) 300 MG capsule Take 1 capsule (300 mg total) by mouth 2 (two) times daily. 1 po BID  . [DISCONTINUED] predniSONE (STERAPRED UNI-PAK 48 TAB) 10 MG (48) TBPK tablet Take as directed   No facility-administered encounter medications on file as of 03/26/2018.       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  HENT: Negative.   Eyes: Negative.   Respiratory: Negative for cough and shortness of breath.   Cardiovascular: Negative for chest pain, palpitations and leg swelling.  Gastrointestinal: Negative for blood in stool, constipation and nausea.  Endocrine: Negative for cold intolerance and heat intolerance.  Genitourinary: Negative for difficulty urinating and dysuria.  Musculoskeletal: Negative for back pain.  Skin: Negative for rash.  Allergic/Immunologic: Negative for environmental allergies and food allergies.  Neurological: Negative for dizziness and weakness.  Hematological: Does not bruise/bleed easily.  Psychiatric/Behavioral: Negative for self-injury, sleep disturbance and suicidal ideas.       Objective:   Physical Exam  Constitutional: He is oriented to person, place, and time. He appears well-developed and well-nourished.  HENT:  Head: Normocephalic and atraumatic.  Right Ear: External ear normal.  Left Ear: External ear normal.  Nose: Nose normal.  Mouth/Throat: Uvula is midline and oropharynx is clear and moist.  Eyes: Pupils are equal, round, and reactive to light. Conjunctivae and EOM are normal.  Neck: Normal range of motion. Neck supple.  Cardiovascular: Normal rate, regular rhythm, normal heart sounds and intact distal pulses.  Pulmonary/Chest: Effort normal. He has decreased breath sounds in the left lower field.  Abdominal: Soft. Bowel sounds are  normal.  Rounded, obese abdomen  Musculoskeletal: Normal range of motion. He exhibits edema (+1 bilateral pedal edema).       Right ankle: He exhibits swelling. He exhibits normal pulse. No tenderness.       Left ankle: He exhibits swelling.  Neurological: He is alert and oriented to person, place, and time. He displays normal reflexes. A cranial nerve deficit is present.  Skin: Skin is warm and dry.  Psychiatric: He has a  normal mood and affect. His speech is normal and behavior is normal. Judgment and thought content normal.     BP 104/67   Pulse 73   Temp (!) 96.9 F (36.1 C) (Oral)   Ht '6\' 1"'  (1.854 m)   Wt 251 lb (113.9 kg)   BMI 33.12 kg/m      Assessment & Plan:  Donald Reeves comes in today with chief complaint of Medical Management of Chronic Issues   Diagnosis and orders addressed:  1. Essential hypertension - olmesartan-hydrochlorothiazide (BENICAR HCT) 20-12.5 MG tablet; Take 1 tablet by mouth daily. Needs to be seen before next refill  Dispense: 90 tablet; Refill: 1 - CMP14+EGFR - Lipid panel -continue to check BP at home and log -continue low salt diet  2. Panic attacks - escitalopram (LEXAPRO) 20 MG tablet; Take 1 tablet (20 mg total) by mouth daily.  Dispense: 90 tablet; Refill: 1 -stress management  3. GAD (generalized anxiety disorder) -stress management  4. Prostate cancer screening - PSA, total and free  5. Smoker -cessation counseling  6. Encounter for colorectal cancer screening - Ambulatory referral to Gastroenterology   Labs pending Health Maintenance reviewed Diet and exercise encouraged  Follow up plan: 3 months   Mary-Margaret Hassell Done, FNP

## 2018-03-27 LAB — CMP14+EGFR
ALBUMIN: 4.7 g/dL (ref 3.5–5.5)
ALK PHOS: 64 IU/L (ref 39–117)
ALT: 30 IU/L (ref 0–44)
AST: 16 IU/L (ref 0–40)
Albumin/Globulin Ratio: 2 (ref 1.2–2.2)
BUN / CREAT RATIO: 8 — AB (ref 9–20)
BUN: 7 mg/dL (ref 6–24)
Bilirubin Total: 0.3 mg/dL (ref 0.0–1.2)
CO2: 26 mmol/L (ref 20–29)
CREATININE: 0.86 mg/dL (ref 0.76–1.27)
Calcium: 9.4 mg/dL (ref 8.7–10.2)
Chloride: 94 mmol/L — ABNORMAL LOW (ref 96–106)
GFR calc Af Amer: 116 mL/min/{1.73_m2} (ref >59)
GFR calc non Af Amer: 100 mL/min/{1.73_m2} (ref >59)
GLUCOSE: 92 mg/dL (ref 65–99)
Globulin, Total: 2.4 g/dL (ref 1.5–4.5)
Potassium: 3.6 mmol/L (ref 3.5–5.2)
Sodium: 138 mmol/L (ref 134–144)
Total Protein: 7.1 g/dL (ref 6.0–8.5)

## 2018-03-27 LAB — PSA, TOTAL AND FREE
PSA FREE: 0.27 ng/mL
PSA, Free Pct: 45 %
Prostate Specific Ag, Serum: 0.6 ng/mL (ref 0.0–4.0)

## 2018-03-27 LAB — LIPID PANEL
CHOLESTEROL TOTAL: 193 mg/dL (ref 100–199)
Chol/HDL Ratio: 5.1 ratio — ABNORMAL HIGH (ref 0.0–5.0)
HDL: 38 mg/dL — ABNORMAL LOW (ref >39)
LDL CALC: 116 mg/dL — AB (ref 0–99)
TRIGLYCERIDES: 196 mg/dL — AB (ref 0–149)
VLDL CHOLESTEROL CAL: 39 mg/dL (ref 5–40)

## 2018-04-21 ENCOUNTER — Ambulatory Visit (INDEPENDENT_AMBULATORY_CARE_PROVIDER_SITE_OTHER): Payer: 59 | Admitting: Family Medicine

## 2018-04-21 ENCOUNTER — Encounter: Payer: Self-pay | Admitting: Family Medicine

## 2018-04-21 VITALS — BP 102/65 | HR 62 | Temp 98.3°F | Ht 73.0 in | Wt 251.0 lb

## 2018-04-21 DIAGNOSIS — J069 Acute upper respiratory infection, unspecified: Secondary | ICD-10-CM | POA: Diagnosis not present

## 2018-04-21 DIAGNOSIS — H66002 Acute suppurative otitis media without spontaneous rupture of ear drum, left ear: Secondary | ICD-10-CM | POA: Diagnosis not present

## 2018-04-21 MED ORDER — FLUTICASONE PROPIONATE 50 MCG/ACT NA SUSP: 2.0000 | Freq: Every day | NASAL | 11 refills | Status: DC

## 2018-04-21 MED ORDER — METHYLPREDNISOLONE ACETATE 80 MG/ML IJ SUSP
80.0000 mg | Freq: Once | INTRAMUSCULAR | Status: AC
  Administered 2018-04-21: 80 mg via INTRAMUSCULAR

## 2018-04-21 MED ORDER — AMOXICILLIN-POT CLAVULANATE 875-125 MG PO TABS: 1.0000 | ORAL_TABLET | Freq: Two times a day (BID) | ORAL | 0 refills | Status: AC

## 2018-04-21 MED ORDER — BENZONATATE 100 MG PO CAPS: 100.0000 mg | ORAL_CAPSULE | Freq: Three times a day (TID) | ORAL | 0 refills | Status: DC | PRN

## 2018-04-21 NOTE — Patient Instructions (Signed)
Upper Respiratory Infection, Adult Most upper respiratory infections (URIs) are caused by a virus. A URI affects the nose, throat, and upper air passages. The most common type of URI is often called "the common cold." Follow these instructions at home:  Take medicines only as told by your doctor.  Gargle warm saltwater or take cough drops to comfort your throat as told by your doctor.  Use a warm mist humidifier or inhale steam from a shower to increase air moisture. This may make it easier to breathe.  Drink enough fluid to keep your pee (urine) clear or pale yellow.  Eat soups and other clear broths.  Have a healthy diet.  Rest as needed.  Go back to work when your fever is gone or your doctor says it is okay. ? You may need to stay home longer to avoid giving your URI to others. ? You can also wear a face mask and wash your hands often to prevent spread of the virus.  Use your inhaler more if you have asthma.  Do not use any tobacco products, including cigarettes, chewing tobacco, or electronic cigarettes. If you need help quitting, ask your doctor. Contact a doctor if:  You are getting worse, not better.  Your symptoms are not helped by medicine.  You have chills.  You are getting more short of breath.  You have brown or red mucus.  You have yellow or brown discharge from your nose.  You have pain in your face, especially when you bend forward.  You have a fever.  You have puffy (swollen) neck glands.  You have pain while swallowing.  You have white areas in the back of your throat. Get help right away if:  You have very bad or constant: ? Headache. ? Ear pain. ? Pain in your forehead, behind your eyes, and over your cheekbones (sinus pain). ? Chest pain.  You have long-lasting (chronic) lung disease and any of the following: ? Wheezing. ? Long-lasting cough. ? Coughing up blood. ? A change in your usual mucus.  You have a stiff neck.  You have  changes in your: ? Vision. ? Hearing. ? Thinking. ? Mood. This information is not intended to replace advice given to you by your health care provider. Make sure you discuss any questions you have with your health care provider. Document Released: 12/11/2007 Document Revised: 02/25/2016 Document Reviewed: 09/29/2013 Elsevier Interactive Patient Education  2018 Elsevier Inc[employer] Otitis Media, Adult Otitis media is redness, soreness, and puffiness (swelling) in the space just behind your eardrum (middle ear). It may be caused by allergies or infection. It often happens along with a cold. Follow these instructions at home:  Take your medicine as told. Finish it even if you start to feel better.  Only take over-the-counter or prescription medicines for pain, discomfort, or fever as told by your doctor.  Follow up with your doctor as told. Contact a doctor if:  You have otitis media only in one ear, or bleeding from your nose, or both.  You notice a lump on your neck.  You are not getting better in 3-5 days.  You feel worse instead of better. Get help right away if:  You have pain that is not helped with medicine.  You have puffiness, redness, or pain around your ear.  You get a stiff neck.  You cannot move part of your face (paralysis).  You notice that the bone behind your ear hurts when you touch it. This information is   not intended to replace advice given to you by your health care provider. Make sure you discuss any questions you have with your health care provider. Document Released: 12/11/2007 Document Revised: 11/30/2015 Document Reviewed: 01/19/2013 Elsevier Interactive Patient Education  2017 Elsevier Inc.  

## 2018-04-21 NOTE — Progress Notes (Signed)
Subjective:    Patient ID: Donald Reeves, male    DOB: 1966/04/18, 52 y.o.   MRN: [id number]  Chief Complaint:  Cough,wheezing, shortness of breath (x 1 week; has used Albuterol twice daily x 1 week)   HPI: Donald Reeves is a 52 y.o. male presenting on 04/21/2018 for Cough,wheezing, shortness of breath (x 1 week; has used Albuterol twice daily x 1 week)  Pt presents today for cough, congestion, and left ear pressure. Onset one week ago. He states the cough is productive at times and worse at night. States he has tried using his Albuterol inhaler a few times with some relief of symptoms. He states he has been out of his flonase for 2 weeks. States he did try over the counter mucinex without relief of symptoms. He denies fever, chills, chest pain, or palpitations. Denies nausea, vomiting, abdominal pain, or myalgias.   Relevant past medical, surgical, family, and social history reviewed and updated as indicated.  Allergies and medications reviewed and updated.   Past Medical History:  Diagnosis Date  . Anxiety     Past Surgical History:  Procedure Laterality Date  . EYE SURGERY    . HERNIA REPAIR      Social History   Socioeconomic History  . Marital status: Married    Spouse name: Not on file  . Number of children: Not on file  . Years of education: Not on file  . Highest education level: Not on file  Occupational History  . Not on file  Social Needs  . Financial resource strain: Not on file  . Food insecurity:    Worry: Not on file    Inability: Not on file  . Transportation needs:    Medical: Not on file    Non-medical: Not on file  Tobacco Use  . Smoking status: Current Every Day Smoker    Packs/day: 1.00    Years: 30.00    Pack years: 30.00    Types: Cigarettes  . Smokeless tobacco: Never Used  . Tobacco comment: ready to quit  Substance and Sexual Activity  . Alcohol use: No  . Drug use: No  . Sexual activity: Not on file  Lifestyle  . Physical  activity:    Days per week: Not on file    Minutes per session: Not on file  . Stress: Not on file  Relationships  . Social connections:    Talks on phone: Not on file    Gets together: Not on file    Attends religious service: Not on file    Active member of club or organization: Not on file    Attends meetings of clubs or organizations: Not on file    Relationship status: Not on file  . Intimate partner violence:    Fear of current or ex partner: Not on file    Emotionally abused: Not on file    Physically abused: Not on file    Forced sexual activity: Not on file  Other Topics Concern  . Not on file  Social History Narrative  . Not on file    Outpatient Encounter Medications as of 04/21/2018  Medication Sig  . albuterol (PROVENTIL HFA;VENTOLIN HFA) 108 (90 Base) MCG/ACT inhaler INHALE 2 PUFFS BY MOUTH EVERY 6 HOURS AS NEEDED FOR WHEEZE OR SHORTNESS OF BREATH  . escitalopram (LEXAPRO) 20 MG tablet Take 1 tablet (20 mg total) by mouth daily.  . fluticasone (FLONASE) 50 MCG/ACT nasal spray Place 2 sprays into both  nostrils daily.  Marland Kitchen olmesartan-hydrochlorothiazide (BENICAR HCT) 20-12.5 MG tablet Take 1 tablet by mouth daily. Needs to be seen before next refill  . sildenafil (REVATIO) 20 MG tablet 2 to 5 tabs once daily as needed for erectile dysfunction  . [DISCONTINUED] fluticasone (FLONASE) 50 MCG/ACT nasal spray Place 2 sprays into both nostrils daily.  Marland Kitchen amoxicillin-clavulanate (AUGMENTIN) 875-125 MG tablet Take 1 tablet by mouth 2 (two) times daily for 7 days.  . benzonatate (TESSALON PERLES) 100 MG capsule Take 1 capsule (100 mg total) by mouth 3 (three) times daily as needed for cough.  . cyclobenzaprine (FLEXERIL) 5 MG tablet Take 1 tablet (5 mg total) by mouth 3 (three) times daily as needed for muscle spasms. (Patient not taking: Reported on 03/26/2018)  . naproxen (NAPROSYN) 500 MG tablet Take 1 tablet (500 mg total) by mouth 2 (two) times daily as needed. (Patient not  taking: Reported on 03/26/2018)   Facility-Administered Encounter Medications as of 04/21/2018  Medication  . methylPREDNISolone acetate (DEPO-MEDROL) injection 80 mg    No Known Allergies  Review of Systems  Constitutional: Negative for chills, fatigue and fever.  HENT: Positive for congestion, ear pain and rhinorrhea. Negative for sinus pressure and voice change.   Eyes: Negative for photophobia and visual disturbance.  Respiratory: Positive for cough and wheezing. Negative for chest tightness and shortness of breath.   Cardiovascular: Negative for chest pain, palpitations and leg swelling.  Gastrointestinal: Negative for abdominal pain, constipation, diarrhea, nausea and vomiting.  Endocrine: Negative for cold intolerance and heat intolerance.  Musculoskeletal: Negative for myalgias.  Neurological: Negative for dizziness, weakness, light-headedness and headaches.  All other systems reviewed and are negative.       Objective:    BP 102/65   Pulse 62   Temp 98.3 F (36.8 C) (Oral)   Ht 6\' 1"  (1.854 m)   Wt 251 lb (113.9 kg)   SpO2 95%   BMI 33.12 kg/m    Wt Readings from Last 3 Encounters:  04/21/18 251 lb (113.9 kg)  03/26/18 251 lb (113.9 kg)  04/28/17 248 lb (112.5 kg)    Physical Exam  Constitutional: He is oriented to person, place, and time. He appears well-developed and well-nourished. He is cooperative. No distress.  HENT:  Head: Normocephalic and atraumatic.  Right Ear: Hearing, tympanic membrane, external ear and ear canal normal.  Left Ear: Hearing, external ear and ear canal normal. Tympanic membrane is erythematous and bulging. Tympanic membrane is not perforated.  Nose: Mucosal edema present.  Mouth/Throat: Uvula is midline and mucous membranes are normal. Posterior oropharyngeal erythema present. No oropharyngeal exudate, posterior oropharyngeal edema or tonsillar abscesses. No tonsillar exudate.  Cardiovascular: Normal rate, regular rhythm and normal  heart sounds. Exam reveals no gallop and no friction rub.  No murmur heard. Pulmonary/Chest: Effort normal. No respiratory distress. He has wheezes (minimal, scattered ). He has rhonchi (mild) in the right upper field and the left upper field. He has no rales.  Neurological: He is alert and oriented to person, place, and time.  Skin: Skin is warm and dry. Capillary refill takes less than 2 seconds.  Psychiatric: He has a normal mood and affect. His behavior is normal. Judgment and thought content normal.  Nursing note and vitals reviewed.       Assessment & Plan:  Aariz was seen today for cough,wheezing, shortness of breath.  Diagnoses and all orders for this visit:  Non-recurrent acute suppurative otitis media of left ear without spontaneous rupture of  tympanic membrane Avoid smoking. Tylenol as needed for fever and pain control. Medications as prescribed.  -     amoxicillin-clavulanate (AUGMENTIN) 875-125 MG tablet; Take 1 tablet by mouth 2 (two) times daily for 7 days. -     fluticasone (FLONASE) 50 MCG/ACT nasal spray; Place 2 sprays into both nostrils daily.  Viral upper respiratory tract infection Increase fluid intake. Add humidity to the air. Continue use of Albuterol inhaler as prescribed. Change toothbrush in 3 days. Medications as prescribed. -     fluticasone (FLONASE) 50 MCG/ACT nasal spray; Place 2 sprays into both nostrils daily. -     methylPREDNISolone acetate (DEPO-MEDROL) injection 80 mg -     benzonatate (TESSALON PERLES) 100 MG capsule; Take 1 capsule (100 mg total) by mouth 3 (three) times daily as needed for cough.      Continue all other maintenance medications.  Follow up plan: Return in about 4 weeks (around 05/19/2018), or if symptoms worsen or fail to improve.  Educational handout given for otitis media, URI  The above assessment and management plan was discussed with the patient. The patient verbalized understanding of and has agreed to the  management plan. Patient is aware to call the clinic if symptoms persist or worsen. Patient is aware when to return to the clinic for a follow-up visit. Patient educated on when it is appropriate to go to the emergency department.   Kari Baars, FNP-C [facility] [phone]

## 2018-04-22 ENCOUNTER — Other Ambulatory Visit: Payer: Self-pay | Admitting: Nurse Practitioner

## 2018-04-22 DIAGNOSIS — J209 Acute bronchitis, unspecified: Secondary | ICD-10-CM

## 2018-04-22 DIAGNOSIS — R062 Wheezing: Secondary | ICD-10-CM

## 2018-05-07 ENCOUNTER — Encounter: Payer: Self-pay | Admitting: Nurse Practitioner

## 2018-09-14 ENCOUNTER — Ambulatory Visit: Payer: 59 | Admitting: Family Medicine

## 2018-09-14 ENCOUNTER — Encounter: Payer: Self-pay | Admitting: Family Medicine

## 2018-09-14 VITALS — BP 98/55 | HR 71 | Temp 98.1°F | Ht 73.0 in | Wt 249.1 lb

## 2018-09-14 DIAGNOSIS — S80922A Unspecified superficial injury of left lower leg, initial encounter: Secondary | ICD-10-CM | POA: Diagnosis not present

## 2018-09-14 DIAGNOSIS — T148XXA Other injury of unspecified body region, initial encounter: Principal | ICD-10-CM

## 2018-09-14 DIAGNOSIS — L089 Local infection of the skin and subcutaneous tissue, unspecified: Secondary | ICD-10-CM

## 2018-09-14 MED ORDER — AMOXICILLIN-POT CLAVULANATE 875-125 MG PO TABS: 1.0000 | ORAL_TABLET | Freq: Two times a day (BID) | ORAL | 0 refills | Status: DC

## 2018-09-14 NOTE — Patient Instructions (Signed)
Apply Heat

## 2018-09-14 NOTE — Progress Notes (Signed)
Subjective:  Patient ID: Donald Reeves, male    DOB: 1966/01/02  Age: 53 y.o. MRN: [id number]  CC: Wound Infection (pt here today c/o wound on lower left leg from 8 days ago )   HPI Donald Reeves presents for redness swelling and pain in the left shin just above the ankle.  This is been increasing for several days.  Donald Reeves had an injury 8 days ago.  A cinderblock flew up when Donald Reeves stepped on it just on the edge and caused a somewhat of a tingling when compact it flew up and scraped the left shin.  Donald Reeves was wearing jeans at the time so Donald Reeves did not realize this extent of his injury.  Starting about 5 days ago Donald Reeves noticed increasing pain.  A day or 2 later it was somewhat red.  Swelling developed as well.  It has also drained some exudate.  The patient says Donald Reeves fell and twisted the right ankle as Donald Reeves went down but it is doing okay now.  1 of his main concerns is a new discoloration of the foot at the medial gutter at the heel.  Depression screen [facility] 2/9 04/21/2018 03/26/2018 04/28/2017  Decreased Interest 0 0 0  Down, Depressed, Hopeless 0 0 0  PHQ - 2 Score 0 0 0    History Donald Reeves has a past medical history of Anxiety.   Donald Reeves has a past surgical history that includes Eye surgery and Hernia repair.   His family history includes Cancer in his father. Donald Reeves was adopted.Donald Reeves reports that Donald Reeves has been smoking cigarettes. Donald Reeves has a 30.00 pack-year smoking history. Donald Reeves has never used smokeless tobacco. Donald Reeves reports that Donald Reeves does not drink alcohol or use drugs.    ROS Review of Systems  Constitutional: Negative for fever.  Respiratory: Negative for shortness of breath.   Musculoskeletal: Negative for arthralgias, gait problem and joint swelling.  Skin: Negative for rash.  Neurological: Negative for light-headedness.    Objective:  BP (!) 98/55   Pulse 71   Temp 98.1 F (36.7 C) (Oral)   Ht 6\' 1"  (1.854 m)   Wt 249 lb 2 oz (113 kg)   BMI 32.87 kg/m   BP Readings from Last 3 Encounters:  09/14/18 (!)  98/55  04/21/18 102/65  03/26/18 104/67    Wt Readings from Last 3 Encounters:  09/14/18 249 lb 2 oz (113 kg)  04/21/18 251 lb (113.9 kg)  03/26/18 251 lb (113.9 kg)     Physical Exam Vitals signs reviewed.  Constitutional:      Appearance: Donald Reeves is well-developed.  HENT:     Head: Normocephalic and atraumatic.     Right Ear: External ear normal.     Left Ear: External ear normal.  Eyes:     Pupils: Pupils are equal, round, and reactive to light.  Neck:     Musculoskeletal: Neck supple.  Cardiovascular:     Rate and Rhythm: Normal rate and regular rhythm.     Heart sounds: No murmur.  Pulmonary:     Effort: Pulmonary effort is normal. No respiratory distress.  Skin:    General: Skin is warm and dry.     Findings: Lesion (Lesion pictured below there is a 1 cm open lesion with a central slough.  There is some spongy edema but no fluctuance with a mild hyperemia/erythema around the border.  There is residual bruise in the heel gutter and crusting without signs of infection su) present.  Neurological:  General: No focal deficit present.     Mental Status: Donald Reeves is alert and oriented to person, place, and time.         Assessment & Plan:   Donald Reeves was seen today for wound infection.  Diagnoses and all orders for this visit:  Wound infection, posttraumatic  Other orders -     amoxicillin-clavulanate (AUGMENTIN) 875-125 MG tablet; Take 1 tablet by mouth 2 (two) times daily. Take all of this medication       I have discontinued Donald Reeves's naproxen, cyclobenzaprine, sildenafil, and benzonatate. I am also having him start on amoxicillin-clavulanate. Additionally, I am having him maintain his olmesartan-hydrochlorothiazide, escitalopram, fluticasone, and albuterol.  Allergies as of 09/14/2018   No Known Allergies     Medication List       Accurate as of September 14, 2018  9:48 AM. Always use your most recent med list.        albuterol 108 (90 Base) MCG/ACT  inhaler Commonly known as:  PROVENTIL HFA;VENTOLIN HFA INHALE 2 PUFFS BY MOUTH EVERY 6 HOURS AS NEEDED FOR WHEEZE OR SHORTNESS OF BREATH   amoxicillin-clavulanate 875-125 MG tablet Commonly known as:  AUGMENTIN Take 1 tablet by mouth 2 (two) times daily. Take all of this medication   escitalopram 20 MG tablet Commonly known as:  LEXAPRO Take 1 tablet (20 mg total) by mouth daily.   fluticasone 50 MCG/ACT nasal spray Commonly known as:  FLONASE Place 2 sprays into both nostrils daily.   olmesartan-hydrochlorothiazide 20-12.5 MG tablet Commonly known as:  Benicar HCT Take 1 tablet by mouth daily. Needs to be seen before next refill      LAst tetanus less than 1 year ago. PT. To elevate, apply heat 2-3 days.  Follow-up: Return if symptoms worsen or fail to improve. S&S infection reviewed.  Mechele Claude, M.D.

## 2018-09-24 ENCOUNTER — Ambulatory Visit: Payer: 59 | Admitting: Nurse Practitioner

## 2018-10-06 ENCOUNTER — Other Ambulatory Visit: Payer: Self-pay | Admitting: Nurse Practitioner

## 2018-10-06 DIAGNOSIS — I10 Essential (primary) hypertension: Secondary | ICD-10-CM

## 2018-10-06 DIAGNOSIS — F410 Panic disorder [episodic paroxysmal anxiety] without agoraphobia: Secondary | ICD-10-CM

## 2018-10-15 ENCOUNTER — Encounter: Payer: Self-pay | Admitting: Family Medicine

## 2018-10-15 ENCOUNTER — Other Ambulatory Visit: Payer: Self-pay

## 2018-10-15 ENCOUNTER — Ambulatory Visit (INDEPENDENT_AMBULATORY_CARE_PROVIDER_SITE_OTHER): Payer: 59 | Admitting: Family Medicine

## 2018-10-15 DIAGNOSIS — S39012A Strain of muscle, fascia and tendon of lower back, initial encounter: Secondary | ICD-10-CM

## 2018-10-15 MED ORDER — NAPROXEN 500 MG PO TABS: 500.0000 mg | ORAL_TABLET | Freq: Two times a day (BID) | ORAL | 1 refills | Status: DC

## 2018-10-15 MED ORDER — CYCLOBENZAPRINE HCL 5 MG PO TABS: 5.0000 mg | ORAL_TABLET | Freq: Three times a day (TID) | ORAL | 0 refills | Status: AC | PRN

## 2018-10-15 NOTE — Progress Notes (Signed)
Virtual Visit via telephone Note Due to COVID-19, visit is conducted virtually and was requested by patient.  I connected with Donata Clay on 10/15/18 at 1500 by telephone and verified that I am speaking with the correct person using two identifiers. Donald Reeves is currently located at home and family is currently with them during visit. The provider, Kari Baars, FNP is located in their office at time of visit.  I discussed the limitations, risks, security and privacy concerns of performing an evaluation and management service by telephone and the availability of in person appointments. I also discussed with the patient that there may be a patient responsible charge related to this service. The patient expressed understanding and agreed to proceed.  Subjective:  Patient ID: Donald Reeves, male    DOB: 05-22-1966, 53 y.o.   MRN: [id number]  Chief Complaint:  Back Pain   HPI: Donald Reeves is a 53 y.o. male presenting on 10/15/2018 for Back Pain   Pt reports midline lower back pain. 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 Pt states the pain is intermittent and worse with certain movements. He states the pain is a 6/10. States worse with lateral rotation. Denies radiation. No weakness, numbness, tingling, fever, chills, loss of bowel or bladder function, or saddle anesthesia. No unexplained weight loss or other red flags.    Relevant past medical, surgical, family, and social history reviewed and updated as indicated.  Allergies and medications reviewed and updated.   Past Medical History:  Diagnosis Date  . Anxiety     Past Surgical History:  Procedure Laterality Date  . EYE SURGERY    . HERNIA REPAIR      Social History   Socioeconomic History  . Marital status: Married    Spouse name: Not on file  . Number of children: Not on file  . Years of education: Not on file  . Highest education level: Not on file  Occupational  History  . Not on file  Social Needs  . Financial resource strain: Not on file  . Food insecurity:    Worry: Not on file    Inability: Not on file  . Transportation needs:    Medical: Not on file    Non-medical: Not on file  Tobacco Use  . Smoking status: Current Every Day Smoker    Packs/day: 1.00    Years: 30.00    Pack years: 30.00    Types: Cigarettes  . Smokeless tobacco: Never Used  . Tobacco comment: ready to quit  Substance and Sexual Activity  . Alcohol use: No  . Drug use: No  . Sexual activity: Not on file  Lifestyle  . Physical activity:    Days per week: Not on file    Minutes per session: Not on file  . Stress: Not on file  Relationships  . Social connections:    Talks on phone: Not on file    Gets together: Not on file    Attends religious service: Not on file    Active member of club or organization: Not on file    Attends meetings of clubs or organizations: Not on file    Relationship status: Not on file  . Intimate partner violence:    Fear of current or ex partner: Not on file    Emotionally abused: Not on file    Physically abused: Not on file    Forced sexual activity: Not on file  Other Topics  Concern  . Not on file  Social History Narrative  . Not on file    Outpatient Encounter Medications as of 10/15/2018  Medication Sig  . albuterol (PROVENTIL HFA;VENTOLIN HFA) 108 (90 Base) MCG/ACT inhaler INHALE 2 PUFFS BY MOUTH EVERY 6 HOURS AS NEEDED FOR WHEEZE OR SHORTNESS OF BREATH  . amoxicillin-clavulanate (AUGMENTIN) 875-125 MG tablet Take 1 tablet by mouth 2 (two) times daily. Take all of this medication  . cyclobenzaprine (FLEXERIL) 5 MG tablet Take 1 tablet (5 mg total) by mouth 3 (three) times daily as needed for up to 10 days for muscle spasms.  Marland Kitchen. escitalopram (LEXAPRO) 20 MG tablet TAKE 1 TABLET BY MOUTH EVERY DAY  . fluticasone (FLONASE) 50 MCG/ACT nasal spray Place 2 sprays into both nostrils daily.  . naproxen (NAPROSYN) 500 MG tablet Take  1 tablet (500 mg total) by mouth 2 (two) times daily with a meal.  . olmesartan-hydrochlorothiazide (BENICAR HCT) 20-12.5 MG tablet TAKE 1 TABLET BY MOUTH DAILY. NEEDS TO BE SEEN BEFORE NEXT REFILL   No facility-administered encounter medications on file as of 10/15/2018.     No Known Allergies  Review of Systems       Observations/Objective: No vital signs or physical exam, this was a telephone or virtual health encounter.  Pt alert and oriented, answers all questions appropriately, and able to speak in full sentences.    Assessment and Plan: Donald Reeves was seen today for back pain.  Diagnoses and all orders for this visit:  Strain of lumbar region, initial encounter -     cyclobenzaprine (FLEXERIL) 5 MG tablet; Take 1 tablet (5 mg total) by mouth 3 (three) times daily as needed for up to 10 days for muscle spasms. -     naproxen (NAPROSYN) 500 MG tablet; Take 1 tablet (500 mg total) by mouth 2 (two) times daily with a meal.     Follow Up Instructions: Return in about 4 weeks (around 11/12/2018), or if symptoms worsen or fail to improve.    I discussed the assessment and treatment plan with the patient. The patient was provided an opportunity to ask questions and all were answered. The patient agreed with the plan and demonstrated an understanding of the instructions.   The patient was advised to call back or seek an in-person evaluation if the symptoms worsen or if the condition fails to improve as anticipated.  The above assessment and management plan was discussed with the patient. The patient verbalized understanding of and has agreed to the management plan. Patient is aware to call the clinic if symptoms persist or worsen. Patient is aware when to return to the clinic for a follow-up visit. Patient educated on when it is appropriate to go to the emergency department.    I provided 15 minutes of non-face-to-face time during this encounter. The call started at 1500. The call  ended at 1515.   Kari BaarsMichelle Shaundrea Carrigg, FNP-C [facility] [address] [place], [place] [id number] [phone]

## 2018-11-03 ENCOUNTER — Other Ambulatory Visit: Payer: Self-pay | Admitting: Nurse Practitioner

## 2018-11-03 DIAGNOSIS — F410 Panic disorder [episodic paroxysmal anxiety] without agoraphobia: Secondary | ICD-10-CM

## 2018-12-11 ENCOUNTER — Telehealth: Payer: Self-pay | Admitting: Nurse Practitioner

## 2018-12-11 NOTE — Telephone Encounter (Signed)
Pt is needing to be scheduled for a telephone visit with MMM for 6 month recheck

## 2018-12-14 ENCOUNTER — Other Ambulatory Visit: Payer: Self-pay

## 2018-12-14 ENCOUNTER — Ambulatory Visit (INDEPENDENT_AMBULATORY_CARE_PROVIDER_SITE_OTHER): Payer: 59 | Admitting: Nurse Practitioner

## 2018-12-14 ENCOUNTER — Encounter: Payer: Self-pay | Admitting: Nurse Practitioner

## 2018-12-14 DIAGNOSIS — F17200 Nicotine dependence, unspecified, uncomplicated: Secondary | ICD-10-CM

## 2018-12-14 DIAGNOSIS — I10 Essential (primary) hypertension: Secondary | ICD-10-CM

## 2018-12-14 DIAGNOSIS — F410 Panic disorder [episodic paroxysmal anxiety] without agoraphobia: Secondary | ICD-10-CM | POA: Diagnosis not present

## 2018-12-14 DIAGNOSIS — Z6832 Body mass index (BMI) 32.0-32.9, adult: Secondary | ICD-10-CM

## 2018-12-14 DIAGNOSIS — J209 Acute bronchitis, unspecified: Secondary | ICD-10-CM

## 2018-12-14 DIAGNOSIS — F411 Generalized anxiety disorder: Secondary | ICD-10-CM | POA: Diagnosis not present

## 2018-12-14 MED ORDER — OLMESARTAN MEDOXOMIL-HCTZ 20-12.5 MG PO TABS: 1.0000 | ORAL_TABLET | Freq: Every day | ORAL | 5 refills | Status: DC

## 2018-12-14 MED ORDER — ESCITALOPRAM OXALATE 20 MG PO TABS: 20.0000 mg | ORAL_TABLET | Freq: Every day | ORAL | 1 refills | Status: DC

## 2018-12-14 MED ORDER — ALBUTEROL SULFATE HFA 108 (90 BASE) MCG/ACT IN AERS: INHALATION_SPRAY | RESPIRATORY_TRACT | 0 refills | Status: DC

## 2018-12-14 NOTE — Progress Notes (Signed)
Virtual Visit via telephone Note  I connected with Donald Reeves on 12/14/18 at 11:20 AM by telephone and verified that I am speaking with the correct person using two identifiers. Donald Reeves is currently located at home and no one is currently with her during visit. The provider, Mary-Margaret Hassell Done, FNP is located in their office at time of visit.  I discussed the limitations, risks, security and privacy concerns of performing an evaluation and management service by telephone and the availability of in person appointments. I also discussed with the patient that there may be a patient responsible charge related to this service. The patient expressed understanding and agreed to proceed.   History and Present Illness:   Chief Complaint: Medical Management of Chronic Issues    HPI:  1. Essential hypertension No c/o chest pain, sob or headache. Does not check blood pressure at home. BP Readings from Last 3 Encounters:  09/14/18 (!) 98/55  04/21/18 102/65  03/26/18 104/67     2. Panic attacks Takes lexapro daily . Denies any recent panic attacks.  3. GAD (generalized anxiety disorder) Anxiety level is good  4. Smoker Smokes at least a pack a day.  5. BMI 32.0-32.9,adult Loss 2 lbs since last visit.    Outpatient Encounter Medications as of 12/14/2018  Medication Sig  . albuterol (PROVENTIL HFA;VENTOLIN HFA) 108 (90 Base) MCG/ACT inhaler INHALE 2 PUFFS BY MOUTH EVERY 6 HOURS AS NEEDED FOR WHEEZE OR SHORTNESS OF BREATH  . amoxicillin-clavulanate (AUGMENTIN) 875-125 MG tablet Take 1 tablet by mouth 2 (two) times daily. Take all of this medication  . escitalopram (LEXAPRO) 20 MG tablet TAKE 1 TABLET BY MOUTH EVERY DAY  . fluticasone (FLONASE) 50 MCG/ACT nasal spray Place 2 sprays into both nostrils daily.  . naproxen (NAPROSYN) 500 MG tablet Take 1 tablet (500 mg total) by mouth 2 (two) times daily with a meal.  . olmesartan-hydrochlorothiazide (BENICAR HCT) 20-12.5 MG  tablet TAKE 1 TABLET BY MOUTH DAILY. NEEDS TO BE SEEN BEFORE NEXT REFILL     Past Surgical History:  Procedure Laterality Date  . EYE SURGERY    . HERNIA REPAIR      Family History  Adopted: Yes  Problem Relation Age of Onset  . Cancer Father     New complaints: None today  Social history: Works at [employer]  Constitutional: Negative for diaphoresis and weight loss.  Eyes: Negative for blurred vision, double vision and pain.  Respiratory: Negative for shortness of breath.   Cardiovascular: Negative for chest pain, palpitations, orthopnea and leg swelling.  Gastrointestinal: Negative for abdominal pain.  Skin: Negative for rash.  Neurological: Negative for dizziness, sensory change, loss of consciousness, weakness and headaches.  Endo/Heme/Allergies: Negative for polydipsia. Does not bruise/bleed easily.  Psychiatric/Behavioral: Negative for memory loss. The patient does not have insomnia.   All other systems reviewed and are negative.    Observations/Objective: Alert and oriented- answers all questions appropriately No distress in voice  Assessment and Plan: Donald Reeves comes in today with chief complaint of Medical Management of Chronic Issues   Diagnosis and orders addressed:  1. Essential hypertension Low sodium diet - olmesartan-hydrochlorothiazide (BENICAR HCT) 20-12.5 MG tablet; Take 1 tablet by mouth daily. Needs to be seen before next refill  Dispense: 30 tablet; Refill: 5  2. Panic attacks Stress management - escitalopram (LEXAPRO) 20 MG tablet; Take 1 tablet (20 mg total) by mouth daily.  Dispense: 90 tablet; Refill:  1  3. GAD (generalized anxiety disorder) Again stressmanagement  4. Smoker Smoking cessation encouraged  5. BMI 32.0-32.9,adult Discussed diet and exercise for person with BMI >25 Will recheck weight in 3-6 months  6. Acute bronchitis, unspecified organism Is starts using inhaler  weekly- need to let me know so can put on  - albuterol (VENTOLIN HFA) 108 (90 Base) MCG/ACT inhaler; INHALE 2 PUFFS BY MOUTH EVERY 6 HOURS AS NEEDED FOR WHEEZE OR SHORTNESS OF BREATH  Dispense: 18 Inhaler; Refill: 0   Previous lab results reviewed Health Maintenance reviewed Diet and exercise encouraged  Follow up plan: 3 months     I discussed the assessment and treatment plan with the patient. The patient was provided an opportunity to ask questions and all were answered. The patient agreed with the plan and demonstrated an understanding of the instructions.   The patient was advised to call back or seek an in-person evaluation if the symptoms worsen or if the condition fails to improve as anticipated.  The above assessment and management plan was discussed with the patient. The patient verbalized understanding of and has agreed to the management plan. Patient is aware to call the clinic if symptoms persist or worsen. Patient is aware when to return to the clinic for a follow-up visit. Patient educated on when it is appropriate to go to the emergency department.   Time call ended:  11:35  I provided 15 minutes of non-face-to-face time during this encounter.    Mary-Margaret Daphine DeutscherMartin, FNP

## 2019-01-21 ENCOUNTER — Other Ambulatory Visit: Payer: Self-pay | Admitting: Nurse Practitioner

## 2019-01-21 DIAGNOSIS — J209 Acute bronchitis, unspecified: Secondary | ICD-10-CM

## 2019-02-19 ENCOUNTER — Other Ambulatory Visit: Payer: Self-pay | Admitting: Nurse Practitioner

## 2019-02-19 DIAGNOSIS — J209 Acute bronchitis, unspecified: Secondary | ICD-10-CM

## 2019-03-16 ENCOUNTER — Ambulatory Visit: Payer: Self-pay | Admitting: Nurse Practitioner

## 2019-03-17 ENCOUNTER — Encounter: Payer: Self-pay | Admitting: Nurse Practitioner

## 2019-04-12 ENCOUNTER — Other Ambulatory Visit: Payer: Self-pay

## 2019-04-12 DIAGNOSIS — J209 Acute bronchitis, unspecified: Secondary | ICD-10-CM

## 2019-04-12 MED ORDER — ALBUTEROL SULFATE HFA 108 (90 BASE) MCG/ACT IN AERS: 1.0000 | INHALATION_SPRAY | Freq: Four times a day (QID) | RESPIRATORY_TRACT | 0 refills | Status: DC | PRN

## 2019-04-15 ENCOUNTER — Telehealth: Payer: 59 | Admitting: Physician Assistant

## 2019-04-15 DIAGNOSIS — R05 Cough: Secondary | ICD-10-CM | POA: Diagnosis not present

## 2019-04-15 DIAGNOSIS — R059 Cough, unspecified: Secondary | ICD-10-CM

## 2019-04-15 MED ORDER — BENZONATATE 100 MG PO CAPS: 100.0000 mg | ORAL_CAPSULE | Freq: Three times a day (TID) | ORAL | 0 refills | Status: DC | PRN

## 2019-04-15 MED ORDER — PREDNISONE 10 MG PO TABS: ORAL_TABLET | ORAL | 0 refills | Status: DC

## 2019-04-15 NOTE — Progress Notes (Signed)
Donald Reeves,  We are sorry that you are not feeling well.  Here is how we plan to help!  It looks like you are due for your 63-month follow-up appointment with [name].  Please be sure to follow-up with her to make sure you are felling better.    Based on your presentation I believe you most likely have A cough due to a virus.  This is called viral bronchitis and is best treated by rest, plenty of fluids and control of the cough.  You may use Ibuprofen or Tylenol as directed to help your symptoms.     In addition you may use A prescription cough medication called Tessalon Perles 100mg . You may take 1-2 capsules every 8 hours as needed for your cough.  Prednisone 10 mg daily for 6 days (see taper instructions below)  Directions for 6 day taper: Day 1: 2 tablets before breakfast, 1 after both lunch & dinner and 2 at bedtime Day 2: 1 tab before breakfast, 1 after both lunch & dinner and 2 at bedtime Day 3: 1 tab at each meal & 1 at bedtime Day 4: 1 tab at breakfast, 1 at lunch, 1 at bedtime Day 5: 1 tab at breakfast & 1 tab at bedtime Day 6: 1 tab at breakfast   From your responses in the eVisit questionnaire you describe inflammation in the upper respiratory tract which is causing a significant cough.  This is commonly called Bronchitis and has four common causes:    Allergies  Viral Infections  Acid Reflux  Bacterial Infection Allergies, viruses and acid reflux are treated by controlling symptoms or eliminating the cause. An example might be a cough caused by taking certain blood pressure medications. You stop the cough by changing the medication. Another example might be a cough caused by acid reflux. Controlling the reflux helps control the cough.  USE OF BRONCHODILATOR ("RESCUE") INHALERS: There is a risk from using your bronchodilator too frequently.  The risk is that over-reliance on a medication which only relaxes the muscles surrounding the breathing tubes can reduce  the effectiveness of medications prescribed to reduce swelling and congestion of the tubes themselves.  Although you feel brief relief from the bronchodilator inhaler, your asthma may actually be worsening with the tubes becoming more swollen and filled with mucus.  This can delay other crucial treatments, such as oral steroid medications. If you need to use a bronchodilator inhaler daily, several times per day, you should discuss this with your provider.  There are probably better treatments that could be used to keep your asthma under control.     HOME CARE . Only take medications as instructed by your medical team. . Complete the entire course of an antibiotic. . Drink plenty of fluids and get plenty of rest. . Avoid close contacts especially the very young and the elderly . Cover your mouth if you cough or cough into your sleeve. . Always remember to wash your hands . A steam or ultrasonic humidifier can help congestion.   GET HELP RIGHT AWAY IF: . You develop worsening fever. . You become short of breath . You cough up blood. . Your symptoms persist after you have completed your treatment plan MAKE SURE YOU   Understand these instructions.  Will watch your condition.  Will get help right away if you are not doing well or get worse.  Your e-visit answers were reviewed by a board certified advanced clinical practitioner to complete your personal care plan.  Depending on the condition, your plan could have included both over the counter or prescription medications. If there is a problem please reply  once you have received a response from your provider. Your safety is important to [place].  If you have drug allergies check your prescription carefully.    You can use MyChart to ask questions about today's visit, request a non-urgent call back, or ask for a work or school excuse for 24 hours related to this e-Visit. If it has been greater than 24 hours you will need to follow up with your  provider, or enter a new e-Visit to address those concerns. You will get an e-mail in the next two days asking about your experience.  I hope that your e-visit has been valuable and will speed your recovery. Thank you for using e-visits.  Greater than 5 minutes, yet less than 10 minutes of time have been spent researching, coordinating and implementing care for this patient today.

## 2019-05-05 ENCOUNTER — Other Ambulatory Visit: Payer: Self-pay | Admitting: Nurse Practitioner

## 2019-05-05 DIAGNOSIS — J209 Acute bronchitis, unspecified: Secondary | ICD-10-CM

## 2019-05-20 ENCOUNTER — Other Ambulatory Visit: Payer: Self-pay | Admitting: Nurse Practitioner

## 2019-05-20 DIAGNOSIS — F410 Panic disorder [episodic paroxysmal anxiety] without agoraphobia: Secondary | ICD-10-CM

## 2019-06-26 ENCOUNTER — Other Ambulatory Visit: Payer: Self-pay | Admitting: Nurse Practitioner

## 2019-06-26 DIAGNOSIS — J209 Acute bronchitis, unspecified: Secondary | ICD-10-CM

## 2019-08-15 ENCOUNTER — Other Ambulatory Visit: Payer: Self-pay | Admitting: Nurse Practitioner

## 2019-08-15 DIAGNOSIS — F410 Panic disorder [episodic paroxysmal anxiety] without agoraphobia: Secondary | ICD-10-CM

## 2019-08-16 MED ORDER — ESCITALOPRAM OXALATE 20 MG PO TABS: 20.0000 mg | ORAL_TABLET | Freq: Every day | ORAL | 0 refills | Status: DC

## 2019-08-22 ENCOUNTER — Other Ambulatory Visit: Payer: Self-pay | Admitting: Nurse Practitioner

## 2019-08-22 DIAGNOSIS — J209 Acute bronchitis, unspecified: Secondary | ICD-10-CM

## 2019-09-19 ENCOUNTER — Other Ambulatory Visit: Payer: Self-pay | Admitting: Nurse Practitioner

## 2019-09-19 DIAGNOSIS — J209 Acute bronchitis, unspecified: Secondary | ICD-10-CM

## 2019-10-19 ENCOUNTER — Other Ambulatory Visit: Payer: Self-pay | Admitting: Nurse Practitioner

## 2019-10-19 DIAGNOSIS — J209 Acute bronchitis, unspecified: Secondary | ICD-10-CM

## 2019-11-06 ENCOUNTER — Other Ambulatory Visit: Payer: Self-pay | Admitting: Nurse Practitioner

## 2019-11-06 DIAGNOSIS — I10 Essential (primary) hypertension: Secondary | ICD-10-CM

## 2019-11-06 DIAGNOSIS — F410 Panic disorder [episodic paroxysmal anxiety] without agoraphobia: Secondary | ICD-10-CM

## 2019-12-05 ENCOUNTER — Other Ambulatory Visit: Payer: Self-pay | Admitting: Nurse Practitioner

## 2019-12-05 DIAGNOSIS — F410 Panic disorder [episodic paroxysmal anxiety] without agoraphobia: Secondary | ICD-10-CM

## 2019-12-05 DIAGNOSIS — I10 Essential (primary) hypertension: Secondary | ICD-10-CM

## 2019-12-09 ENCOUNTER — Other Ambulatory Visit: Payer: Self-pay | Admitting: Nurse Practitioner

## 2019-12-09 DIAGNOSIS — I10 Essential (primary) hypertension: Secondary | ICD-10-CM

## 2019-12-09 DIAGNOSIS — F410 Panic disorder [episodic paroxysmal anxiety] without agoraphobia: Secondary | ICD-10-CM

## 2019-12-09 NOTE — Telephone Encounter (Signed)
MMM NTBS 30 days given 11/08/19

## 2019-12-10 NOTE — Telephone Encounter (Signed)
Appointment scheduled.

## 2019-12-14 ENCOUNTER — Telehealth (INDEPENDENT_AMBULATORY_CARE_PROVIDER_SITE_OTHER): Payer: 59 | Admitting: Nurse Practitioner

## 2019-12-14 ENCOUNTER — Encounter: Payer: Self-pay | Admitting: Nurse Practitioner

## 2019-12-14 DIAGNOSIS — F410 Panic disorder [episodic paroxysmal anxiety] without agoraphobia: Secondary | ICD-10-CM | POA: Diagnosis not present

## 2019-12-14 DIAGNOSIS — I10 Essential (primary) hypertension: Secondary | ICD-10-CM | POA: Diagnosis not present

## 2019-12-14 DIAGNOSIS — Z6833 Body mass index (BMI) 33.0-33.9, adult: Secondary | ICD-10-CM

## 2019-12-14 DIAGNOSIS — F411 Generalized anxiety disorder: Secondary | ICD-10-CM

## 2019-12-14 DIAGNOSIS — E7800 Pure hypercholesterolemia, unspecified: Secondary | ICD-10-CM

## 2019-12-14 DIAGNOSIS — F17200 Nicotine dependence, unspecified, uncomplicated: Secondary | ICD-10-CM

## 2019-12-14 DIAGNOSIS — E785 Hyperlipidemia, unspecified: Secondary | ICD-10-CM | POA: Insufficient documentation

## 2019-12-14 DIAGNOSIS — Z125 Encounter for screening for malignant neoplasm of prostate: Secondary | ICD-10-CM

## 2019-12-14 MED ORDER — BUSPIRONE HCL 15 MG PO TABS: 15.0000 mg | ORAL_TABLET | Freq: Three times a day (TID) | ORAL | 1 refills | Status: DC

## 2019-12-14 MED ORDER — ESCITALOPRAM OXALATE 20 MG PO TABS: 20.0000 mg | ORAL_TABLET | Freq: Every day | ORAL | 1 refills | Status: DC

## 2019-12-14 MED ORDER — OLMESARTAN MEDOXOMIL-HCTZ 20-12.5 MG PO TABS: 1.0000 | ORAL_TABLET | Freq: Every day | ORAL | 1 refills | Status: DC

## 2019-12-14 NOTE — Progress Notes (Signed)
Virtual Visit via video Note   Due to COVID-19 pandemic this visit was conducted virtually. This visit type was conducted due to national recommendations for restrictions regarding the COVID-19 Pandemic (e.g. social distancing, sheltering in place) in an effort to limit this patient's exposure and mitigate transmission in our community. All issues noted in this document were discussed and addressed.  A physical exam was not performed with this format.  I connected with  Donald Reeves  on 12/14/19 at 3:10 by video and verified that I am speaking with the correct person using two identifiers. Donald Reeves is currently located at work in his car and no one is currently with her during visit. The provider, Mary-Margaret Hassell Done, FNP is located in their office at time of visit.  I discussed the limitations, risks, security and privacy concerns of performing an evaluation and management service by telephone and the availability of in person appointments. I also discussed with the patient that there may be a patient responsible charge related to this service. The patient expressed understanding and agreed to proceed.   History and Present Illness:   Chief Complaint: Medical Management of Chronic Issues    HPI:  1. Essential hypertension No c/o chest pain, ob or headache. Does not check blood presure at home. BP Readings from Last 3 Encounters:  09/14/18 (!) 98/55  04/21/18 102/65  03/26/18 104/67     2. Panic attacks No recent attack. He has had xanax in the past. Took him 3 year to take 30 tablets. He is now out of meds.  3. GAD (generalized anxiety disorder) Is doing well on lexapro. No side effects from medication. GAD 7 : Generalized Anxiety Score 12/14/2019 11/19/2016  Nervous, Anxious, on Edge 1 2  Control/stop worrying 1 2  Worry too much - different things 1 2  Trouble relaxing 1 1  Restless 0 1  Easily annoyed or irritable 0 1  Afraid - awful might happen 0 1  Total GAD  7 Score 4 10  Anxiety Difficulty Not difficult at all Not difficult at all      4. Smoker smokes over a pack a day  5. BMI 33.0-33.9,adult *no recent weight changes Wt Readings from Last 3 Encounters:  09/14/18 249 lb 2 oz (113 kg)  04/21/18 251 lb (113.9 kg)  03/26/18 251 lb (113.9 kg)   BMI Readings from Last 3 Encounters:  09/14/18 32.87 kg/m  04/21/18 33.12 kg/m  03/26/18 33.12 kg/m    6. Pure hypercholesterolemia Does not watch diet and does very little exercise  7. Prostate cancer screening Has not had lab done in over 2 years    Outpatient Encounter Medications as of 12/14/2019  Medication Sig  . albuterol (VENTOLIN HFA) 108 (90 Base) MCG/ACT inhaler Inhale 1-2 puffs into the lungs every 6 (six) hours as needed for wheezing or shortness of breath. (Needs to be seen before next refill)  . benzonatate (TESSALON) 100 MG capsule Take 1-2 capsules (100-200 mg total) by mouth 3 (three) times daily as needed for cough.  . escitalopram (LEXAPRO) 20 MG tablet Take 1 tablet (20 mg total) by mouth daily. (Needs to be seen before next refill)  . fluticasone (FLONASE) 50 MCG/ACT nasal spray Place 2 sprays into both nostrils daily.  . naproxen (NAPROSYN) 500 MG tablet Take 1 tablet (500 mg total) by mouth 2 (two) times daily with a meal.  . olmesartan-hydrochlorothiazide (BENICAR HCT) 20-12.5 MG tablet TAKE 1 TABLET BY MOUTH DAILY. NEEDS TO BE SEEN  BEFORE NEXT REFILL  . [DISCONTINUED] predniSONE (DELTASONE) 10 MG tablet Day 1: 2 tablets before breakfast, 1 after both lunch & dinner and 2 at bedtime; Day 2: 1 tab before breakfast, 1 after both lunch & dinner and 2 at bedtime; Day 3: 1 tab at each meal & 1 at bedtime; Day 4: 1 tab at breakfast, 1 at lunch, 1 at bedtime; Day 5: 1 tab at breakfast & 1 tab at bedtime; Day 6: 1 tab at breakfast   No facility-administered encounter medications on file as of 12/14/2019.    Past Surgical History:  Procedure Laterality Date  . EYE SURGERY     . HERNIA REPAIR      Family History  Adopted: Yes  Problem Relation Age of Onset  . Cancer Father     New complaints: None today  Social history: Lives with family- works at [employer].  Controlled substance contract: n/a    Review of Systems  Constitutional: Negative for diaphoresis and weight loss.  Eyes: Negative for blurred vision, double vision and pain.  Respiratory: Negative for shortness of breath.   Cardiovascular: Negative for chest pain, palpitations, orthopnea and leg swelling.  Gastrointestinal: Negative for abdominal pain.  Skin: Negative for rash.  Neurological: Negative for dizziness, sensory change, loss of consciousness, weakness and headaches.  Endo/Heme/Allergies: Negative for polydipsia. Does not bruise/bleed easily.  Psychiatric/Behavioral: Negative for memory loss. The patient does not have insomnia.   All other systems reviewed and are negative.      Observations/Objective: Alert and oriented- answers all questions appropriately No distress  Assessment and Plan: Damont Balles comes in today with chief complaint of Medical Management of Chronic Issues   Diagnosis and orders addressed:  1. Essential hypertension Low sodium diet - CBC with Differential/Platelet - CMP14+EGFR - olmesartan-hydrochlorothiazide (BENICAR HCT) 20-12.5 MG tablet; Take 1 tablet by mouth daily. Needs to be seen before next refill  Dispense: 90 tablet; Refill: 1  2. Panic attacks Stress management - escitalopram (LEXAPRO) 20 MG tablet; Take 1 tablet (20 mg total) by mouth daily. (Needs to be seen before next refill)  Dispense: 90 tablet; Refill: 1  3. GAD (generalized anxiety disorder) Stress management - busPIRone (BUSPAR) 15 MG tablet; Take 1 tablet (15 mg total) by mouth 3 (three) times daily.  Dispense: 30 tablet; Refill: 1  4. Smoker smoking cessation encouraged  5. BMI 33.0-33.9,adult Discussed diet and exercise for person with BMI  >25 Will recheck weight in 3-6 months  6. Pure hypercholesterolemia Low fat diet - Lipid panel  7. Prostate cancer screening - PSA, total and free   Labs pending Health Maintenance reviewed Diet and exercise encouraged   Follow Up Instructions: 6 months    I discussed the assessment and treatment plan with the patient. The patient was provided an opportunity to ask questions and all were answered. The patient agreed with the plan and demonstrated an understanding of the instructions.   The patient was advised to call back or seek an in-person evaluation if the symptoms worsen or if the condition fails to improve as anticipated.  The above assessment and management plan was discussed with the patient. The patient verbalized understanding of and has agreed to the management plan. Patient is aware to call the clinic if symptoms persist or worsen. Patient is aware when to return to the clinic for a follow-up visit. Patient educated on when it is appropriate to go to the emergency department.   Time call ended:3:25  I provided  15 minutes of face-to-face time during this encounter.    Mary-Margaret Hassell Done, FNP

## 2020-05-12 ENCOUNTER — Other Ambulatory Visit: Payer: Self-pay | Admitting: Nurse Practitioner

## 2020-05-12 DIAGNOSIS — I10 Essential (primary) hypertension: Secondary | ICD-10-CM

## 2020-05-12 DIAGNOSIS — F410 Panic disorder [episodic paroxysmal anxiety] without agoraphobia: Secondary | ICD-10-CM

## 2020-06-22 ENCOUNTER — Other Ambulatory Visit: Payer: Self-pay | Admitting: Nurse Practitioner

## 2020-06-22 DIAGNOSIS — I10 Essential (primary) hypertension: Secondary | ICD-10-CM

## 2020-07-05 ENCOUNTER — Other Ambulatory Visit: Payer: Self-pay | Admitting: Nurse Practitioner

## 2020-07-05 DIAGNOSIS — I10 Essential (primary) hypertension: Secondary | ICD-10-CM

## 2020-07-06 ENCOUNTER — Other Ambulatory Visit: Payer: Self-pay

## 2020-07-06 ENCOUNTER — Encounter: Payer: Self-pay | Admitting: Nurse Practitioner

## 2020-07-06 ENCOUNTER — Ambulatory Visit: Payer: 59 | Admitting: Nurse Practitioner

## 2020-07-06 VITALS — BP 109/71 | HR 93 | Temp 98.0°F | Resp 20 | Ht 73.0 in | Wt 258.0 lb

## 2020-07-06 DIAGNOSIS — F411 Generalized anxiety disorder: Secondary | ICD-10-CM | POA: Diagnosis not present

## 2020-07-06 DIAGNOSIS — E7800 Pure hypercholesterolemia, unspecified: Secondary | ICD-10-CM

## 2020-07-06 DIAGNOSIS — F410 Panic disorder [episodic paroxysmal anxiety] without agoraphobia: Secondary | ICD-10-CM

## 2020-07-06 DIAGNOSIS — Z1212 Encounter for screening for malignant neoplasm of rectum: Secondary | ICD-10-CM

## 2020-07-06 DIAGNOSIS — I10 Essential (primary) hypertension: Secondary | ICD-10-CM | POA: Diagnosis not present

## 2020-07-06 DIAGNOSIS — F17200 Nicotine dependence, unspecified, uncomplicated: Secondary | ICD-10-CM

## 2020-07-06 DIAGNOSIS — Z6833 Body mass index (BMI) 33.0-33.9, adult: Secondary | ICD-10-CM

## 2020-07-06 DIAGNOSIS — Z1211 Encounter for screening for malignant neoplasm of colon: Secondary | ICD-10-CM

## 2020-07-06 MED ORDER — OLMESARTAN MEDOXOMIL-HCTZ 20-12.5 MG PO TABS: 1.0000 | ORAL_TABLET | Freq: Every day | ORAL | 1 refills | Status: DC

## 2020-07-06 NOTE — Progress Notes (Signed)
Subjective:    Patient ID: Donald Reeves, male    DOB: 1965-12-31, 54 y.o.   MRN: [id number]   Chief Complaint: medical management of chronic issues     HPI:  1. Primary hypertension No c/o chest pain, sob or headache. Does not check blood pressure at home. BP Readings from Last 3 Encounters:  07/06/20 109/71  09/14/18 (!) 98/55  04/21/18 102/65      2. Pure hypercholesterolemia Does not really watch diet and does very little exercise Lab Results  Component Value Date   CHOL 193 03/26/2018   HDL 38 (L) 03/26/2018   LDLCALC 116 (H) 03/26/2018   TRIG 196 (H) 03/26/2018   CHOLHDL 5.1 (H) 03/26/2018  The 10-year ASCVD risk score Mikey Bussing DC Jr., et al., 2013) is: 10.1%    3. Panic attacks He is on lexapro and says working well for him. He has had no recent panic attacks.  4. GAD (generalized anxiety disorder) He is buspar and says he has only taken it 1x since he got prescription.  GAD 7 : Generalized Anxiety Score 07/06/2020 12/14/2019 11/19/2016  Nervous, Anxious, on Edge 0 1 2  Control/stop worrying 0 1 2  Worry too much - different things 0 1 2  Trouble relaxing 0 1 1  Restless 0 0 1  Easily annoyed or irritable 0 0 1  Afraid - awful might happen 0 0 1  Total GAD 7 Score 0 4 10  Anxiety Difficulty - Not difficult at all Not difficult at all       5. Smoker Smokes at least a pack a day. Has been smoking for many yeasr  6. BMI 33.0-33.9,adult Weight is up 9lbs since last visit Wt Readings from Last 3 Encounters:  07/06/20 258 lb (117 kg)  09/14/18 249 lb 2 oz (113 kg)  04/21/18 251 lb (113.9 kg)   BMI Readings from Last 3 Encounters:  07/06/20 34.04 kg/m  09/14/18 32.87 kg/m  04/21/18 33.12 kg/m       Outpatient Encounter Medications as of 07/06/2020  Medication Sig  . albuterol (VENTOLIN HFA) 108 (90 Base) MCG/ACT inhaler Inhale 1-2 puffs into the lungs every 6 (six) hours as needed for wheezing or shortness of breath. (Needs to be seen  before next refill)  . benzonatate (TESSALON) 100 MG capsule Take 1-2 capsules (100-200 mg total) by mouth 3 (three) times daily as needed for cough.  . busPIRone (BUSPAR) 15 MG tablet Take 1 tablet (15 mg total) by mouth 3 (three) times daily.  Marland Kitchen escitalopram (LEXAPRO) 20 MG tablet TAKE 1 TABLET (20 MG TOTAL) BY MOUTH DAILY. (NEEDS TO BE SEEN BEFORE NEXT REFILL)  . fluticasone (FLONASE) 50 MCG/ACT nasal spray Place 2 sprays into both nostrils daily.  . naproxen (NAPROSYN) 500 MG tablet Take 1 tablet (500 mg total) by mouth 2 (two) times daily with a meal.  . olmesartan-hydrochlorothiazide (BENICAR HCT) 20-12.5 MG tablet Take 1 tablet by mouth daily. Needs to be seen before next refill     Past Surgical History:  Procedure Laterality Date  . EYE SURGERY    . HERNIA REPAIR      Family History  Adopted: Yes  Problem Relation Age of Onset  . Cancer Father     New complaints: None today  Social history: Lives with his wife and 2 daughters  Controlled substance contract: n/a    Review of Systems  Constitutional: Negative for diaphoresis.  HENT: Positive for congestion.   Eyes: Negative for pain.  Respiratory: Positive for cough (slight). Negative for shortness of breath.   Cardiovascular: Negative for chest pain, palpitations and leg swelling.  Gastrointestinal: Negative for abdominal pain.  Endocrine: Negative for polydipsia.  Genitourinary: Negative.   Skin: Negative for rash.  Neurological: Negative for dizziness, weakness and headaches.  Hematological: Does not bruise/bleed easily.  All other systems reviewed and are negative.      Objective:   Physical Exam Vitals and nursing note reviewed.  Constitutional:      Appearance: Normal appearance. He is well-developed and well-nourished.  HENT:     Head: Normocephalic.     Nose: Nose normal.     Mouth/Throat:     Mouth: Oropharynx is clear and moist.  Eyes:     Extraocular Movements: EOM normal.     Pupils:  Pupils are equal, round, and reactive to light.  Neck:     Thyroid: No thyroid mass or thyromegaly.     Vascular: No carotid bruit or JVD.     Trachea: Phonation normal.  Cardiovascular:     Rate and Rhythm: Normal rate and regular rhythm.  Pulmonary:     Effort: Pulmonary effort is normal. No respiratory distress.     Breath sounds: Wheezing (occasional exp wheeze bil) present.  Abdominal:     General: Bowel sounds are normal. Aorta is normal.     Palpations: Abdomen is soft.     Tenderness: There is no abdominal tenderness.  Musculoskeletal:        General: Normal range of motion.     Cervical back: Normal range of motion and neck supple.  Lymphadenopathy:     Cervical: No cervical adenopathy.  Skin:    General: Skin is warm and dry.  Neurological:     Mental Status: He is alert and oriented to person, place, and time.  Psychiatric:        Mood and Affect: Mood and affect normal.        Behavior: Behavior normal.        Thought Content: Thought content normal.        Judgment: Judgment normal.     BP 109/71   Pulse 93   Temp 98 F (36.7 C) (Temporal)   Resp 20   Ht '6\' 1"'  (1.854 m)   Wt 258 lb (117 kg)   SpO2 97%   BMI 34.04 kg/m        Assessment & Plan:  Donald Reeves comes in today with chief complaint of Medical Management of Chronic Issues   Diagnosis and orders addressed:  1. Primary hypertension Low sodium diet - olmesartan-hydrochlorothiazide (BENICAR HCT) 20-12.5 MG tablet; Take 1 tablet by mouth daily. Needs to be seen before next refill  Dispense: 90 tablet; Refill: 1 - CBC with Differential/Platelet - CMP14+EGFR  2. Pure hypercholesterolemia Low fat diet - Lipid panel  3. Panic attacks Stress management  4. GAD (generalized anxiety disorder)  5. Smoker Smoking cessation encouraged Handout given  6. BMI 33.0-33.9,adult Discussed diet and exercise for person with BMI >25 Will recheck weight in 3-6 months   Labs pending Health  Maintenance reviewed- cologuard ordered Diet and exercise encouraged  Follow up plan: 6 months   [place], FNP

## 2020-07-06 NOTE — Patient Instructions (Signed)
  Cologuard  Your provider has prescribed Cologuard, an easy-to-use, noninvasive test for colon cancer screening, based on the latest advances in stool DNA science.   Here's what will happen next:  1. You may receive a call or email from [facility] to confirm your mailing address and insurance information 2. Your kit will be shipped directly to you 3. You collect your stool sample in the privacy of your own home. Please follow the instructions that come with the kit 4. You return the kit via [facility] shipping or pick-up, in the same box it arrived in 5. You should receive a call with the results once they are available. If you do not receive a call, please contact our office at [phone]  Insurance Coverage  Cologuard is covered by Medicare and most major insurers Cologuard is covered by Medicare and Medicare Advantage with no co-pay or deductible for eligible patients ages 19-85. Nationwide, more than 94% of Cologuard patients have no out-of-pocket cost for screening. . Based on the [place] should be covered by most private insurers with no co-pay or deductible for eligible patients (ages 13-75; at average risk for colon cancer; without symptoms). Currently, ~74% of Cologuard patients 45-49 have had no out-of-pocket cost for screening.  . Many national and regional payers have begun paying for CRC screening at 45. [employer] continues to work with payers to expand coverage and access for patients ages 38-49.   Only your healthcare insurance provider can confirm how Cologuard will be covered for you. If you have questions about coverage, you can contact your insurance company directly or ask the specialists at [employer] to do that for you. A Customer Support Specialist can be reached at [phone].    Patient Support Screening for colon cancer is very important to your good health, so if you have any questions at all, please call [employer], commercial Customer Support Specialists at [phone]. They are available 24 hours a day, 6 days a week. An instructional video is available to view online at [email or web address]   *Information and graphics obtained from [email or web address]

## 2020-07-07 LAB — CBC WITH DIFFERENTIAL/PLATELET
Basophils Absolute: 0.1 10*3/uL (ref 0.0–0.2)
Basos: 1 %
EOS (ABSOLUTE): 0.3 10*3/uL (ref 0.0–0.4)
Eos: 5 %
Hematocrit: 42.5 % (ref 37.5–51.0)
Hemoglobin: 14.9 g/dL (ref 13.0–17.7)
Immature Grans (Abs): 0 10*3/uL (ref 0.0–0.1)
Immature Granulocytes: 1 %
Lymphocytes Absolute: 1.9 10*3/uL (ref 0.7–3.1)
Lymphs: 29 %
MCH: 31.2 pg (ref 26.6–33.0)
MCHC: 35.1 g/dL (ref 31.5–35.7)
MCV: 89 fL (ref 79–97)
Monocytes Absolute: 0.6 10*3/uL (ref 0.1–0.9)
Monocytes: 9 %
Neutrophils Absolute: 3.7 10*3/uL (ref 1.4–7.0)
Neutrophils: 55 %
Platelets: 201 10*3/uL (ref 150–450)
RBC: 4.77 x10E6/uL (ref 4.14–5.80)
RDW: 11.4 % — ABNORMAL LOW (ref 11.6–15.4)
WBC: 6.5 10*3/uL (ref 3.4–10.8)

## 2020-07-07 LAB — CMP14+EGFR
ALT: 44 IU/L (ref 0–44)
AST: 29 IU/L (ref 0–40)
Albumin/Globulin Ratio: 1.8 (ref 1.2–2.2)
Albumin: 4.4 g/dL (ref 3.8–4.9)
Alkaline Phosphatase: 64 IU/L (ref 44–121)
BUN/Creatinine Ratio: 17 (ref 9–20)
BUN: 14 mg/dL (ref 6–24)
Bilirubin Total: 0.5 mg/dL (ref 0.0–1.2)
CO2: 26 mmol/L (ref 20–29)
Calcium: 9.1 mg/dL (ref 8.7–10.2)
Chloride: 96 mmol/L (ref 96–106)
Creatinine, Ser: 0.83 mg/dL (ref 0.76–1.27)
GFR calc Af Amer: 116 mL/min/{1.73_m2} (ref >59)
GFR calc non Af Amer: 100 mL/min/{1.73_m2} (ref >59)
Globulin, Total: 2.4 g/dL (ref 1.5–4.5)
Glucose: 117 mg/dL — ABNORMAL HIGH (ref 65–99)
Potassium: 4.3 mmol/L (ref 3.5–5.2)
Sodium: 136 mmol/L (ref 134–144)
Total Protein: 6.8 g/dL (ref 6.0–8.5)

## 2020-07-07 LAB — LIPID PANEL
Chol/HDL Ratio: 5.6 ratio — ABNORMAL HIGH (ref 0.0–5.0)
Cholesterol, Total: 200 mg/dL — ABNORMAL HIGH (ref 100–199)
HDL: 36 mg/dL — ABNORMAL LOW (ref >39)
LDL Chol Calc (NIH): 142 mg/dL — ABNORMAL HIGH (ref 0–99)
Triglycerides: 119 mg/dL (ref 0–149)
VLDL Cholesterol Cal: 22 mg/dL (ref 5–40)

## 2020-07-10 MED ORDER — ATORVASTATIN CALCIUM 40 MG PO TABS: 40.0000 mg | ORAL_TABLET | Freq: Every day | ORAL | 1 refills | Status: DC

## 2020-07-10 NOTE — Addendum Note (Signed)
Addended by: Bennie Pierini on: 07/10/2020 10:41 AM   Modules accepted: Orders

## 2020-08-28 LAB — COLOGUARD: Cologuard: NEGATIVE

## 2020-10-17 ENCOUNTER — Other Ambulatory Visit: Payer: Self-pay

## 2020-10-17 ENCOUNTER — Ambulatory Visit: Payer: 59 | Admitting: Family

## 2020-10-17 ENCOUNTER — Encounter: Payer: Self-pay | Admitting: Family

## 2020-10-17 VITALS — BP 107/71 | HR 57 | Temp 97.7°F | Ht 73.0 in | Wt 251.2 lb

## 2020-10-17 DIAGNOSIS — M25562 Pain in left knee: Secondary | ICD-10-CM | POA: Diagnosis not present

## 2020-10-17 MED ORDER — DICLOFENAC SODIUM 75 MG PO TBEC: 75.0000 mg | DELAYED_RELEASE_TABLET | Freq: Two times a day (BID) | ORAL | 1 refills | Status: DC

## 2020-10-17 NOTE — Patient Instructions (Signed)
Acute Knee Pain, Adult Acute knee pain is sudden and may be caused by damage, swelling, or irritation of the muscles and tissues that support the knee. Pain may result from:  A fall.  An injury to the knee from twisting motions.  A hit to the knee.  Infection. Acute knee pain may go away on its own with time and rest. If it does not, your health care provider may order tests to find the cause of the pain. These may include:  Imaging tests, such as an X-ray, MRI, CT scan, or ultrasound.  Joint aspiration. In this test, fluid is removed from the knee and evaluated.  Arthroscopy. In this test, a lighted tube is inserted into the knee and an image is projected onto a TV screen.  Biopsy. In this test, a sample of tissue is removed from the body and studied under a microscope. Follow these instructions at home: If you have a knee sleeve or brace:  Wear the knee sleeve or brace as told by your health care provider. Remove it only as told by your health care provider.  Loosen it if your toes tingle, become numb, or turn cold and blue.  Keep it clean.  If the knee sleeve or brace is not waterproof: ? Do not let it get wet. ? Cover it with a watertight covering when you take a bath or shower.   Activity  Rest your knee.  Do not do things that cause pain or make pain worse.  Avoid high-impact activities or exercises, such as running, jumping rope, or doing jumping jacks.  Work with a physical therapist to make a safe exercise program, as recommended by your health care provider. Do exercises as told by your physical therapist. Managing pain, stiffness, and swelling  If directed, put ice on the affected knee. To do this: ? If you have a removable knee sleeve or brace, remove it as told by your health care provider. ? Put ice in a plastic bag. ? Place a towel between your skin and the bag. ? Leave the ice on for 20 minutes, 2-3 times a day. ? Remove the ice if your skin turns bright  red. This is very important. If you cannot feel pain, heat, or cold, you have a greater risk of damage to the area.  If directed, use an elastic bandage to put pressure (compression) on your injured knee. This may control swelling, give support, and help with discomfort.  Raise (elevate) your knee above the level of your heart while you are sitting or lying down.  Sleep with a pillow under your knee.   General instructions  Take over-the-counter and prescription medicines only as told by your health care provider.  Do not use any products that contain nicotine or tobacco, such as cigarettes, e-cigarettes, and chewing tobacco. If you need help quitting, ask your health care provider.  If you are overweight, work with your health care provider and a dietitian to set a weight-loss goal that is healthy and reasonable for you. Extra weight can put pressure on your knee.  Pay attention to any changes in your symptoms.  Keep all follow-up visits. This is important. Contact a health care provider if:  Your knee pain continues, changes, or gets worse.  You have a fever along with knee pain.  Your knee feels warm to the touch or is red.  Your knee buckles or locks up. Get help right away if:  Your knee swells, and the swelling becomes   worse.  You cannot move your knee.  You have severe pain in your knee that cannot be managed with pain medicine. Summary  Acute knee pain can be caused by a fall, an injury, an infection, or damage, swelling, or irritation of the tissues that support your knee.  Your health care provider may perform tests to find out the cause of the pain.  Pay attention to any changes in your symptoms. Relieve your pain with rest, medicines, light activity, and the use of ice.  Get help right away if your knee swells, you cannot move your knee, or you have severe pain that cannot be managed with medicine. This information is not intended to replace advice given to you  by your health care provider. Make sure you discuss any questions you have with your health care provider. Document Revised: 12/08/2019 Document Reviewed: 12/08/2019 Elsevier Patient Education  2021 Elsevier Inc.  

## 2020-10-17 NOTE — Progress Notes (Signed)
Subjective:    Patient ID: Donald Reeves, male    DOB: Nov 27, 1965, 55 y.o.   MRN: [id number]  Chief Complaint  Patient presents with  . Knee Pain    Left knee, started X2 weeks ago. no injury with pain. Pt states it stays in knee with no radiation up or down and is sore to the touch    Knee Pain  The incident occurred more than 1 week ago. There was no injury mechanism. The pain is present in the left knee. The pain is moderate. The pain has been intermittent since onset. Pertinent negatives include no loss of motion, muscle weakness or numbness. He reports no foreign bodies present. The symptoms are aggravated by movement and weight bearing. He has tried NSAIDs for the symptoms. The treatment provided mild relief.      Review of Systems  Neurological: Negative for numbness.  All other systems reviewed and are negative.      Objective:   Physical Exam Vitals reviewed.  Constitutional:      General: He is not in acute distress.    Appearance: He is well-developed.  HENT:     Head: Normocephalic.     Right Ear: Tympanic membrane normal.     Left Ear: Tympanic membrane normal.  Eyes:     General:        Right eye: No discharge.        Left eye: No discharge.     Pupils: Pupils are equal, round, and reactive to light.  Neck:     Thyroid: No thyromegaly.  Cardiovascular:     Rate and Rhythm: Normal rate and regular rhythm.     Heart sounds: Normal heart sounds. No murmur heard.   Pulmonary:     Effort: Pulmonary effort is normal. No respiratory distress.     Breath sounds: Normal breath sounds. No wheezing.  Abdominal:     General: Bowel sounds are normal. There is no distension.     Palpations: Abdomen is soft.     Tenderness: There is no abdominal tenderness.  Musculoskeletal:        General: Tenderness (slight knee pain with flexion) present. Normal range of motion.     Cervical back: Normal range of motion and neck supple.     Comments: crepitus felt one exam  with flexion and extension  Skin:    General: Skin is warm and dry.     Findings: No erythema or rash.  Neurological:     Mental Status: He is alert and oriented to person, place, and time.     Cranial Nerves: No cranial nerve deficit.     Deep Tendon Reflexes: Reflexes are normal and symmetric.  Psychiatric:        Behavior: Behavior normal.        Thought Content: Thought content normal.        Judgment: Judgment normal.      BP 107/71   Pulse (!) 57   Temp 97.7 F (36.5 C) (Temporal)   Ht 6\' 1"  (1.854 m)   Wt 251 lb 3.2 oz (113.9 kg)   SpO2 98%   BMI 33.14 kg/m      Assessment & Plan:  Donald Reeves comes in today with chief complaint of Knee Pain (Left knee, started X2 weeks ago. no injury with pain. Pt states it stays in knee with no radiation up or down and is sore to the touch)   Diagnosis and orders addressed:  1. Acute pain  of left knee Rest ROM exercises  Diclofenac BID for 7 days with food No other NSAID's RTO if symptoms worsen or do not improve  - diclofenac (VOLTAREN) 75 MG EC tablet; Take 1 tablet (75 mg total) by mouth 2 (two) times daily.  Dispense: 60 tablet; Refill: 1   Jannifer Rodney, FNP

## 2020-12-11 ENCOUNTER — Other Ambulatory Visit: Payer: Self-pay | Admitting: Family

## 2020-12-11 DIAGNOSIS — M25562 Pain in left knee: Secondary | ICD-10-CM

## 2020-12-16 ENCOUNTER — Other Ambulatory Visit: Payer: Self-pay

## 2020-12-16 DIAGNOSIS — J209 Acute bronchitis, unspecified: Secondary | ICD-10-CM

## 2020-12-18 MED ORDER — ALBUTEROL SULFATE HFA 108 (90 BASE) MCG/ACT IN AERS: 1.0000 | INHALATION_SPRAY | Freq: Four times a day (QID) | RESPIRATORY_TRACT | 0 refills | Status: DC | PRN

## 2021-01-06 ENCOUNTER — Other Ambulatory Visit: Payer: Self-pay | Admitting: Nurse Practitioner

## 2021-01-06 DIAGNOSIS — I10 Essential (primary) hypertension: Secondary | ICD-10-CM

## 2021-01-07 ENCOUNTER — Other Ambulatory Visit: Payer: Self-pay | Admitting: Family

## 2021-01-07 DIAGNOSIS — M25562 Pain in left knee: Secondary | ICD-10-CM

## 2021-01-09 ENCOUNTER — Ambulatory Visit: Payer: 59 | Admitting: Nurse Practitioner

## 2021-01-11 ENCOUNTER — Encounter: Payer: Self-pay | Admitting: Nurse Practitioner

## 2021-01-11 ENCOUNTER — Ambulatory Visit: Payer: 59 | Admitting: Nurse Practitioner

## 2021-01-11 ENCOUNTER — Other Ambulatory Visit: Payer: Self-pay | Admitting: Nurse Practitioner

## 2021-01-11 DIAGNOSIS — J209 Acute bronchitis, unspecified: Secondary | ICD-10-CM

## 2021-01-16 ENCOUNTER — Ambulatory Visit (INDEPENDENT_AMBULATORY_CARE_PROVIDER_SITE_OTHER): Payer: 59

## 2021-01-16 ENCOUNTER — Encounter: Payer: Self-pay | Admitting: Nurse Practitioner

## 2021-01-16 ENCOUNTER — Ambulatory Visit: Payer: 59 | Admitting: Nurse Practitioner

## 2021-01-16 ENCOUNTER — Other Ambulatory Visit: Payer: Self-pay

## 2021-01-16 VITALS — BP 107/69 | HR 52 | Temp 97.7°F | Resp 20 | Ht 73.0 in | Wt 252.0 lb

## 2021-01-16 DIAGNOSIS — E7800 Pure hypercholesterolemia, unspecified: Secondary | ICD-10-CM

## 2021-01-16 DIAGNOSIS — F17200 Nicotine dependence, unspecified, uncomplicated: Secondary | ICD-10-CM

## 2021-01-16 DIAGNOSIS — F411 Generalized anxiety disorder: Secondary | ICD-10-CM

## 2021-01-16 DIAGNOSIS — Z125 Encounter for screening for malignant neoplasm of prostate: Secondary | ICD-10-CM

## 2021-01-16 DIAGNOSIS — I10 Essential (primary) hypertension: Secondary | ICD-10-CM

## 2021-01-16 DIAGNOSIS — F410 Panic disorder [episodic paroxysmal anxiety] without agoraphobia: Secondary | ICD-10-CM | POA: Diagnosis not present

## 2021-01-16 DIAGNOSIS — Z6833 Body mass index (BMI) 33.0-33.9, adult: Secondary | ICD-10-CM

## 2021-01-16 MED ORDER — ESCITALOPRAM OXALATE 20 MG PO TABS: 20.0000 mg | ORAL_TABLET | Freq: Every day | ORAL | 1 refills | Status: DC

## 2021-01-16 MED ORDER — OLMESARTAN MEDOXOMIL-HCTZ 20-12.5 MG PO TABS: 1.0000 | ORAL_TABLET | Freq: Every day | ORAL | 1 refills | Status: DC

## 2021-01-16 MED ORDER — ATORVASTATIN CALCIUM 40 MG PO TABS: 40.0000 mg | ORAL_TABLET | Freq: Every day | ORAL | 1 refills | Status: DC

## 2021-01-16 NOTE — Progress Notes (Signed)
Subjective:    Patient ID: Donald Reeves, male    DOB: March 29, 1966, 55 y.o.   MRN: [id number]   Chief Complaint: medical management of chronic issues     HPI:  1. Primary hypertension No c/o chest pain, sob or headache. Does not check blood pressure at home. BP Readings from Last 3 Encounters:  10/17/20 107/71  07/06/20 109/71  09/14/18 (!) 98/55     2. Pure hypercholesterolemia Does not really watch diet and does no exercise. We added lipitor at last visit. No c/o medication side effects. Lab Results  Component Value Date   CHOL 200 (H) 07/06/2020   HDL 36 (L) 07/06/2020   LDLCALC 142 (H) 07/06/2020   TRIG 119 07/06/2020   CHOLHDL 5.6 (H) 07/06/2020     3. GAD (generalized anxiety disorder) Is on buspar. Works well for him. He says he has not been taking because really does not do a lot for him. GAD 7 : Generalized Anxiety Score 01/16/2021 07/06/2020 12/14/2019 11/19/2016  Nervous, Anxious, on Edge 1 0 1 2  Control/stop worrying 1 0 1 2  Worry too much - different things 1 0 1 2  Trouble relaxing 1 0 1 1  Restless 0 0 0 1  Easily annoyed or irritable 0 0 0 1  Afraid - awful might happen 0 0 0 1  Total GAD 7 Score 4 0 4 10  Anxiety Difficulty Not difficult at all - Not difficult at all Not difficult at all      4. Panic attacks Is on lexapro and is doing well. No c/o medication side effects. Depression screen [facility] 2/9 01/16/2021 10/17/2020 07/06/2020  Decreased Interest 0 0 0  Down, Depressed, Hopeless 0 0 0  PHQ - 2 Score 0 0 0  Altered sleeping 0 - -  Tired, decreased energy 0 - -  Change in appetite 0 - -  Feeling bad or failure about yourself  0 - -  Trouble concentrating 0 - -  Moving slowly or fidgety/restless 0 - -  Suicidal thoughts 0 - -  PHQ-9 Score 0 - -  Difficult doing work/chores Not difficult at all - -     5. Smoker Smokes over a pack a day.  6. BMI 33.0-33.9,adult No recent weight changes. Wt Readings from Last 3 Encounters:   01/16/21 252 lb (114.3 kg)  10/17/20 251 lb 3.2 oz (113.9 kg)  07/06/20 258 lb (117 kg)   BMI Readings from Last 3 Encounters:  01/16/21 33.25 kg/m  10/17/20 33.14 kg/m  07/06/20 34.04 kg/m      Outpatient Encounter Medications as of 01/16/2021  Medication Sig   albuterol (VENTOLIN HFA) 108 (90 Base) MCG/ACT inhaler INHALE 1-2 PUFFS EVERY 6 HOURS AS NEEDED FOR WHEEZING OR SHORTNESS OF BREATH. (NTBS NEXT REFILL)   atorvastatin (LIPITOR) 40 MG tablet Take 1 tablet (40 mg total) by mouth daily. (NEEDS TO BE SEEN BEFORE NEXT REFILL)   busPIRone (BUSPAR) 15 MG tablet Take 1 tablet (15 mg total) by mouth 3 (three) times daily.   diclofenac (VOLTAREN) 75 MG EC tablet TAKE 1 TABLET BY MOUTH TWICE A DAY   escitalopram (LEXAPRO) 20 MG tablet TAKE 1 TABLET (20 MG TOTAL) BY MOUTH DAILY. (NEEDS TO BE SEEN BEFORE NEXT REFILL)   olmesartan-hydrochlorothiazide (BENICAR HCT) 20-12.5 MG tablet TAKE 1 TABLET BY MOUTH DAILY. NEEDS TO BE SEEN BEFORE NEXT REFILL   No facility-administered encounter medications on file as of 01/16/2021.    Past Surgical History:  Procedure Laterality Date   EYE SURGERY     HERNIA REPAIR      Family History  Adopted: Yes  Problem Relation Age of Onset   Cancer Father     New complaints: None today  Social history: Lives his wife and kids  Controlled substance contract: n/a      Review of Systems  Constitutional:  Negative for diaphoresis.  Eyes:  Negative for pain.  Respiratory:  Negative for shortness of breath.   Cardiovascular:  Negative for chest pain, palpitations and leg swelling.  Gastrointestinal:  Negative for abdominal pain.  Endocrine: Negative for polydipsia.  Skin:  Negative for rash.  Neurological:  Negative for dizziness, weakness and headaches.  Hematological:  Does not bruise/bleed easily.  All other systems reviewed and are negative.     Objective:   Physical Exam Vitals and nursing note reviewed.  Constitutional:       Appearance: Normal appearance. He is well-developed.  HENT:     Head: Normocephalic.     Nose: Nose normal.  Eyes:     Pupils: Pupils are equal, round, and reactive to light.  Neck:     Thyroid: No thyroid mass or thyromegaly.     Vascular: No carotid bruit or JVD.     Trachea: Phonation normal.  Cardiovascular:     Rate and Rhythm: Normal rate and regular rhythm.  Pulmonary:     Effort: Pulmonary effort is normal. No respiratory distress.     Breath sounds: Normal breath sounds.  Abdominal:     General: Bowel sounds are normal.     Palpations: Abdomen is soft.     Tenderness: There is no abdominal tenderness.  Musculoskeletal:        General: Normal range of motion.     Cervical back: Normal range of motion and neck supple.  Lymphadenopathy:     Cervical: No cervical adenopathy.  Skin:    General: Skin is warm and dry.  Neurological:     Mental Status: He is alert and oriented to person, place, and time.  Psychiatric:        Behavior: Behavior normal.        Thought Content: Thought content normal.        Judgment: Judgment normal.    BP 107/69   Pulse (!) 52   Temp 97.7 F (36.5 C) (Temporal)   Resp 20   Ht 6' 1" (1.854 m)   Wt 252 lb (114.3 kg)   SpO2 97%   BMI 33.25 kg/m        Assessment & Plan:   Jahzeel Poythress comes in today with chief complaint of Medical Management of Chronic Issues   Diagnosis and orders addressed:  1. Primary hypertension Low sodium diet - CBC with Differential/Platelet - CMP14+EGFR - olmesartan-hydrochlorothiazide (BENICAR HCT) 20-12.5 MG tablet; Take 1 tablet by mouth daily. Needs to be seen before next refill  Dispense: 90 tablet; Refill: 1  2. Pure hypercholesterolemia Low fat diet - Lipid panel - atorvastatin (LIPITOR) 40 MG tablet; Take 1 tablet (40 mg total) by mouth daily. (NEEDS TO BE SEEN BEFORE NEXT REFILL)  Dispense: 90 tablet; Refill: 1  3. GAD (generalized anxiety disorder) Stress management  4. Panic  attacks Stress management - escitalopram (LEXAPRO) 20 MG tablet; Take 1 tablet (20 mg total) by mouth daily. (Needs to be seen before next refill)  Dispense: 90 tablet; Refill: 1  5. Smoker Smoking cessation encouraged - DG Chest 2 View  6. BMI  33.0-33.9,adult Discussed diet and exercise for person with BMI >25 Will recheck weight in 3-6 months   7. Prostate cancer screening - PSA, total and free   Labs pending Health Maintenance reviewed Diet and exercise encouraged  Follow up plan: 6 months   [place], FNP

## 2021-01-17 LAB — CMP14+EGFR
ALT: 19 IU/L (ref 0–44)
AST: 13 IU/L (ref 0–40)
Albumin/Globulin Ratio: 1.6 (ref 1.2–2.2)
Albumin: 4.5 g/dL (ref 3.8–4.9)
Alkaline Phosphatase: 79 IU/L (ref 44–121)
BUN/Creatinine Ratio: 16 (ref 9–20)
BUN: 15 mg/dL (ref 6–24)
Bilirubin Total: 0.4 mg/dL (ref 0.0–1.2)
CO2: 25 mmol/L (ref 20–29)
Calcium: 9.3 mg/dL (ref 8.7–10.2)
Chloride: 96 mmol/L (ref 96–106)
Creatinine, Ser: 0.93 mg/dL (ref 0.76–1.27)
Globulin, Total: 2.8 g/dL (ref 1.5–4.5)
Glucose: 90 mg/dL (ref 65–99)
Potassium: 4.1 mmol/L (ref 3.5–5.2)
Sodium: 137 mmol/L (ref 134–144)
Total Protein: 7.3 g/dL (ref 6.0–8.5)
eGFR: 98 mL/min/{1.73_m2} (ref >59)

## 2021-01-17 LAB — CBC WITH DIFFERENTIAL/PLATELET
Basophils Absolute: 0.1 10*3/uL (ref 0.0–0.2)
Basos: 1 %
EOS (ABSOLUTE): 0.2 10*3/uL (ref 0.0–0.4)
Eos: 3 %
Hematocrit: 40.4 % (ref 37.5–51.0)
Hemoglobin: 14.1 g/dL (ref 13.0–17.7)
Immature Grans (Abs): 0 10*3/uL (ref 0.0–0.1)
Immature Granulocytes: 0 %
Lymphocytes Absolute: 2.3 10*3/uL (ref 0.7–3.1)
Lymphs: 28 %
MCH: 30.9 pg (ref 26.6–33.0)
MCHC: 34.9 g/dL (ref 31.5–35.7)
MCV: 88 fL (ref 79–97)
Monocytes Absolute: 0.7 10*3/uL (ref 0.1–0.9)
Monocytes: 8 %
Neutrophils Absolute: 4.9 10*3/uL (ref 1.4–7.0)
Neutrophils: 60 %
Platelets: 199 10*3/uL (ref 150–450)
RBC: 4.57 x10E6/uL (ref 4.14–5.80)
RDW: 11.9 % (ref 11.6–15.4)
WBC: 8.3 10*3/uL (ref 3.4–10.8)

## 2021-01-17 LAB — LIPID PANEL
Chol/HDL Ratio: 3.8 ratio (ref 0.0–5.0)
Cholesterol, Total: 123 mg/dL (ref 100–199)
HDL: 32 mg/dL — ABNORMAL LOW (ref >39)
LDL Chol Calc (NIH): 66 mg/dL (ref 0–99)
Triglycerides: 139 mg/dL (ref 0–149)
VLDL Cholesterol Cal: 25 mg/dL (ref 5–40)

## 2021-01-17 LAB — PSA, TOTAL AND FREE
PSA, Free Pct: 45.7 %
PSA, Free: 0.32 ng/mL
Prostate Specific Ag, Serum: 0.7 ng/mL (ref 0.0–4.0)

## 2021-02-23 ENCOUNTER — Ambulatory Visit: Payer: 59 | Admitting: Nurse Practitioner

## 2021-02-23 ENCOUNTER — Encounter: Payer: Self-pay | Admitting: Nurse Practitioner

## 2021-02-23 DIAGNOSIS — J209 Acute bronchitis, unspecified: Secondary | ICD-10-CM

## 2021-02-23 MED ORDER — NIRMATRELVIR/RITONAVIR (PAXLOVID)TABLET: 3.0000 | ORAL_TABLET | Freq: Two times a day (BID) | ORAL | 0 refills | Status: AC

## 2021-02-23 MED ORDER — BENZONATATE 100 MG PO CAPS: 100.0000 mg | ORAL_CAPSULE | Freq: Three times a day (TID) | ORAL | 0 refills | Status: DC | PRN

## 2021-02-23 MED ORDER — ALBUTEROL SULFATE HFA 108 (90 BASE) MCG/ACT IN AERS: 2.0000 | INHALATION_SPRAY | Freq: Four times a day (QID) | RESPIRATORY_TRACT | 1 refills | Status: DC | PRN

## 2021-02-23 NOTE — Progress Notes (Signed)
Virtual Visit  Note Due to COVID-19 pandemic this visit was conducted virtually. This visit type was conducted due to national recommendations for restrictions regarding the COVID-19 Pandemic (e.g. social distancing, sheltering in place) in an effort to limit this patient's exposure and mitigate transmission in our community. All issues noted in this document were discussed and addressed.  A physical exam was not performed with this format.  I connected with Donald Reeves on 02/23/21 at 12:05 by telephone and verified that I am speaking with the correct person using two identifiers. Donald Reeves is currently located at home and no one is currently with him during visit. The provider, [name], FNP is located in their office at time of visit.  I discussed the limitations, risks, security and privacy concerns of performing an evaluation and management service by telephone and the availability of in person appointments. I also discussed with the patient that there may be a patient responsible charge related to this service. The patient expressed understanding and agreed to proceed.   History and Present Illness:   Chief Complaint: Covid Positive   HPI Patient develop a cough and congestion Wednesday evening. He thought he just had cold. Had headache yesterday. Now has cough, congestion and PND. Had to use inhaler this morning to open up chest.  Review of Systems  Constitutional:  Positive for malaise/fatigue. Negative for chills and fever.  HENT:  Positive for congestion. Negative for sinus pain and sore throat.   Respiratory:  Positive for cough and sputum production. Negative for shortness of breath.   Musculoskeletal:  Negative for myalgias.  Neurological:  Positive for headaches.  All other systems reviewed and are negative.   Observations/Objective: Alert and oriented- answers all questions appropriately No distress No cough durin visit  Assessment and  Plan: Donald Reeves in today with chief complaint of Covid Positive   1. Acute bronchitis, unspecified organism 1. Take meds as prescribed 2. Use a cool mist humidifier especially during the winter months and when heat has been humid. 3. Use saline nose sprays frequently 4. Saline irrigations of the nose can be very helpful if done frequently.  * 4X daily for 1 week*  * Use of a nettie pot can be helpful with this. Follow directions with this* 5. Drink plenty of fluids 6. Keep thermostat turn down low 7.For any cough or congestion  Use plain Mucinex- regular strength or max strength is fine   * Children- consult with Pharmacist for dosing 8. For fever or aces or pains- take tylenol or ibuprofen appropriate for age and weight.  * for fevers greater than 101 orally you may alternate ibuprofen and tylenol every  3 hours.    - albuterol (VENTOLIN HFA) 108 (90 Base) MCG/ACT inhaler; Inhale 2 puffs into the lungs every 6 (six) hours as needed for wheezing or shortness of breath.  Dispense: 6.7 each; Refill: 1 - benzonatate (TESSALON PERLES) 100 MG capsule; Take 1 capsule (100 mg total) by mouth 3 (three) times daily as needed.  Dispense: 20 capsule; Refill: 0    Follow Up Instructions: prn    I discussed the assessment and treatment plan with the patient. The patient was provided an opportunity to ask questions and all were answered. The patient agreed with the plan and demonstrated an understanding of the instructions.   The patient was advised to call back or seek an in-person evaluation if the symptoms worsen or if the condition fails to improve as anticipated.  The above assessment  and management plan was discussed with the patient. The patient verbalized understanding of and has agreed to the management plan. Patient is aware to call the clinic if symptoms persist or worsen. Patient is aware when to return to the clinic for a follow-up visit. Patient educated on when it is  appropriate to go to the emergency department.   Time call ended:  12:17  I provided 12 minutes of  non face-to-face time during this encounter.    [name], FNP

## 2021-04-10 ENCOUNTER — Ambulatory Visit: Payer: 59 | Admitting: Nurse Practitioner

## 2021-04-10 ENCOUNTER — Other Ambulatory Visit: Payer: Self-pay

## 2021-04-10 ENCOUNTER — Encounter: Payer: Self-pay | Admitting: Nurse Practitioner

## 2021-04-10 VITALS — BP 91/55 | HR 62 | Temp 97.9°F | Resp 20 | Ht 73.0 in | Wt 254.0 lb

## 2021-04-10 DIAGNOSIS — R609 Edema, unspecified: Secondary | ICD-10-CM

## 2021-04-10 DIAGNOSIS — I10 Essential (primary) hypertension: Secondary | ICD-10-CM

## 2021-04-10 MED ORDER — FUROSEMIDE 20 MG PO TABS: 20.0000 mg | ORAL_TABLET | Freq: Every day | ORAL | 1 refills | Status: DC

## 2021-04-10 MED ORDER — OLMESARTAN MEDOXOMIL 20 MG PO TABS: 20.0000 mg | ORAL_TABLET | Freq: Every day | ORAL | 1 refills | Status: DC

## 2021-04-10 NOTE — Patient Instructions (Signed)

## 2021-04-10 NOTE — Progress Notes (Signed)
   Subjective:    Patient ID: Donald Reeves, male    DOB: [birth year], 55 y.o.   MRN: [id number]   Chief Complaint: Knee Pain (Left//) and ankeles swollen   HPI Patient comes in c/o bil lower ext edema. Including feet and ankles. Gest some better during the night but never completely resolves. He went to [facility] last week and his legs were really bad while he was  there. Lab Results  Component Value Date   CREATININE 0.93 01/16/2021        Review of Systems  Constitutional:  Negative for diaphoresis.  Eyes:  Negative for pain.  Respiratory:  Negative for shortness of breath.   Cardiovascular:  Positive for leg swelling. Negative for chest pain and palpitations.  Gastrointestinal:  Negative for abdominal pain.  Endocrine: Negative for polydipsia.  Skin:  Negative for rash.  Neurological:  Negative for dizziness, weakness and headaches.  Hematological:  Does not bruise/bleed easily.  All other systems reviewed and are negative.     Objective:   Physical Exam Vitals and nursing note reviewed.  Constitutional:      Appearance: Normal appearance.  Cardiovascular:     Rate and Rhythm: Normal rate and regular rhythm.     Heart sounds: Normal heart sounds.  Pulmonary:     Effort: Pulmonary effort is normal.     Breath sounds: Normal breath sounds.  Skin:    General: Skin is warm.     Capillary Refill: Capillary refill takes less than 2 seconds.  Neurological:     General: No focal deficit present.     Mental Status: He is alert and oriented to person, place, and time.  Psychiatric:        Mood and Affect: Mood normal.        Behavior: Behavior normal.   BP (!) 91/55   Pulse 62   Temp 97.9 F (36.6 C) (Temporal)   Resp 20   Ht 6\' 1"  (1.854 m)   Wt 254 lb (115.2 kg)   BMI 33.51 kg/m         Assessment & Plan:   Donald Reeves in today with chief complaint of Knee Pain (Left//) and ankeles swollen   1. Primary hypertension Stopped benicar Hctz and  ahnaged to just benicar - olmesartan (BENICAR) 20 MG tablet; Take 1 tablet (20 mg total) by mouth daily.  Dispense: 90 tablet; Refill: 1  2. Peripheral edema Added lasix Compression socks Elevate legs when sitting Weight daily- if have 2 lb weight gain from one day to the next let me know Avoid salt in diet - furosemide (LASIX) 20 MG tablet; Take 1 tablet (20 mg total) by mouth daily.  Dispense: 90 tablet; Refill: 1    The above assessment and management plan was discussed with the patient. The patient verbalized understanding of and has agreed to the management plan. Patient is aware to call the clinic if symptoms persist or worsen. Patient is aware when to return to the clinic for a follow-up visit. Patient educated on when it is appropriate to go to the emergency department.   Mary-Margaret Donata Clay, FNP

## 2021-05-07 ENCOUNTER — Encounter: Payer: Self-pay | Admitting: Nurse Practitioner

## 2021-05-07 ENCOUNTER — Ambulatory Visit: Payer: 59 | Admitting: Nurse Practitioner

## 2021-05-07 DIAGNOSIS — J40 Bronchitis, not specified as acute or chronic: Secondary | ICD-10-CM | POA: Diagnosis not present

## 2021-05-07 MED ORDER — AZITHROMYCIN 250 MG PO TABS: ORAL_TABLET | ORAL | 0 refills | Status: DC

## 2021-05-07 MED ORDER — PREDNISONE 20 MG PO TABS: 40.0000 mg | ORAL_TABLET | Freq: Every day | ORAL | 0 refills | Status: AC

## 2021-05-07 MED ORDER — BENZONATATE 100 MG PO CAPS
100.0000 mg | ORAL_CAPSULE | Freq: Three times a day (TID) | ORAL | 0 refills | Status: DC | PRN
Start: 2021-05-07 — End: 2021-10-16

## 2021-05-07 NOTE — Progress Notes (Signed)
Virtual Visit  Note Due to COVID-19 pandemic this visit was conducted virtually. This visit type was conducted due to national recommendations for restrictions regarding the COVID-19 Pandemic (e.g. social distancing, sheltering in place) in an effort to limit this patient's exposure and mitigate transmission in our community. All issues noted in this document were discussed and addressed.  A physical exam was not performed with this format.  I connected with Donald Reeves on 05/07/21 at 1:52 by telephone and verified that I am speaking with the correct person using two identifiers. Donald Reeves is currently located at home and no one is currently with him during visit. The provider, [name], FNP is located in their office at time of visit.  I discussed the limitations, risks, security and privacy concerns of performing an evaluation and management service by telephone and the availability of in person appointments. I also discussed with the patient that there may be a patient responsible charge related to this service. The patient expressed understanding and agreed to proceed.   History and Present Illness:  Patient states that he developed a cough last week. Has gradually gotten worse. Has been wheezing some and chest feels tight when he cough. He has done several covid test and were all negative. He has been using OTC meds which has not helped. Albuterol is no help.     Review of Systems  Constitutional:  Positive for malaise/fatigue. Negative for chills and fever.  HENT:  Negative for congestion, sinus pain and sore throat.   Respiratory:  Positive for cough, shortness of breath and wheezing. Negative for sputum production.   Musculoskeletal:  Negative for myalgias.  Neurological:  Negative for headaches.    Observations/Objective: Alert and oriented- answers all questions appropriately No distress Tight cough noted during visit  Assessment and Plan: Donald Reeves in today with chief complaint of No chief complaint on file.   1. Bronchitis 1. Take meds as prescribed 2. Use a cool mist humidifier especially during the winter months and when heat has been humid. 3. Use saline nose sprays frequently 4. Saline irrigations of the nose can be very helpful if done frequently.  * 4X daily for 1 week*  * Use of a nettie pot can be helpful with this. Follow directions with this* 5. Drink plenty of fluids 6. Keep thermostat turn down low 7.For any cough or congestion  Use plain Mucinex- regular strength or max strength is fine   * Children- consult with Pharmacist for dosing 8. For fever or aces or pains- take tylenol or ibuprofen appropriate for age and weight.  * for fevers greater than 101 orally you may alternate ibuprofen and tylenol every  3 hours.    - azithromycin (ZITHROMAX Z-PAK) 250 MG tablet; As directed  Dispense: 6 tablet; Refill: 0 - predniSONE (DELTASONE) 20 MG tablet; Take 2 tablets (40 mg total) by mouth daily with breakfast for 5 days. 2 po daily for 5 days  Dispense: 10 tablet; Refill: 0 - benzonatate (TESSALON PERLES) 100 MG capsule; Take 1 capsule (100 mg total) by mouth 3 (three) times daily as needed.  Dispense: 20 capsule; Refill: 0    Follow Up Instructions: prn    I discussed the assessment and treatment plan with the patient. The patient was provided an opportunity to ask questions and all were answered. The patient agreed with the plan and demonstrated an understanding of the instructions.   The patient was advised to call back or seek an in-person evaluation  if the symptoms worsen or if the condition fails to improve as anticipated.  The above assessment and management plan was discussed with the patient. The patient verbalized understanding of and has agreed to the management plan. Patient is aware to call the clinic if symptoms persist or worsen. Patient is aware when to return to the clinic for a follow-up visit.  Patient educated on when it is appropriate to go to the emergency department.   Time call ended:  2:04  I provided 12 minutes of  non face-to-face time during this encounter.    [name], FNP

## 2021-07-20 ENCOUNTER — Ambulatory Visit: Payer: 59 | Admitting: Nurse Practitioner

## 2021-08-02 ENCOUNTER — Encounter: Payer: Self-pay | Admitting: Family Medicine

## 2021-08-02 ENCOUNTER — Ambulatory Visit (INDEPENDENT_AMBULATORY_CARE_PROVIDER_SITE_OTHER): Payer: 59 | Admitting: Family Medicine

## 2021-08-02 DIAGNOSIS — J329 Chronic sinusitis, unspecified: Secondary | ICD-10-CM

## 2021-08-02 DIAGNOSIS — J40 Bronchitis, not specified as acute or chronic: Secondary | ICD-10-CM

## 2021-08-02 MED ORDER — AMOXICILLIN-POT CLAVULANATE 875-125 MG PO TABS: 1.0000 | ORAL_TABLET | Freq: Two times a day (BID) | ORAL | 0 refills | Status: DC

## 2021-08-02 MED ORDER — PSEUDOEPHEDRINE-GUAIFENESIN ER 120-1200 MG PO TB12: 1.0000 | ORAL_TABLET | Freq: Two times a day (BID) | ORAL | 0 refills | Status: DC

## 2021-08-02 NOTE — Progress Notes (Signed)
Subjective:    Patient ID: Donald Reeves, male    DOB: [birth year], 56 y.o.   MRN: [id number]   HPI: Donald Reeves is a 56 y.o. male presenting for 2 days of chest congestion and head congestion. Sore throat yesterday got worse through the day. A little better this morning. Coughing. Some sputum. Clear sputum. No fever. No dyspnea. No otalgia. Having pressure around eyes. Not much rhino. Having posterior drainage.    Depression screen [facility] 2/9 04/10/2021 01/16/2021 10/17/2020 07/06/2020 12/14/2019  Decreased Interest 0 0 0 0 0  Down, Depressed, Hopeless 0 0 0 0 0  PHQ - 2 Score 0 0 0 0 0  Altered sleeping 0 0 - - -  Tired, decreased energy 0 0 - - -  Change in appetite 0 0 - - -  Feeling bad or failure about yourself  0 0 - - -  Trouble concentrating 0 0 - - -  Moving slowly or fidgety/restless 0 0 - - -  Suicidal thoughts 0 0 - - -  PHQ-9 Score 0 0 - - -  Difficult doing work/chores Not difficult at all Not difficult at all - - -     Relevant past medical, surgical, family and social history reviewed and updated as indicated.  Interim medical history since our last visit reviewed. Allergies and medications reviewed and updated.  ROS:  Review of Systems  Constitutional:  Negative for activity change, appetite change, chills and fever.  HENT:  Positive for congestion, postnasal drip, rhinorrhea and sinus pressure. Negative for ear discharge, ear pain, hearing loss, nosebleeds, sneezing and trouble swallowing.   Respiratory:  Positive for chest tightness. Negative for shortness of breath.   Cardiovascular:  Negative for chest pain and palpitations.  Skin:  Negative for rash.    Social History   Tobacco Use  Smoking Status Every Day   Packs/day: 1.00   Years: 30.00   Pack years: 30.00   Types: Cigarettes  Smokeless Tobacco Never  Tobacco Comments   ready to quit       Objective:     Wt Readings from Last 3 Encounters:  04/10/21 254 lb (115.2 kg)  01/16/21 252 lb  (114.3 kg)  10/17/20 251 lb 3.2 oz (113.9 kg)     Exam deferred. Pt. Harboring due to COVID 19. Phone visit performed.   Assessment & Plan:   1. Sinobronchitis     Meds ordered this encounter  Medications   amoxicillin-clavulanate (AUGMENTIN) 875-125 MG tablet    Sig: Take 1 tablet by mouth 2 (two) times daily. Take all of this medication    Dispense:  20 tablet    Refill:  0   Pseudoephedrine-Guaifenesin [phone] MG TB12    Sig: Take 1 tablet by mouth 2 (two) times daily. For congestion    Dispense:  20 tablet    Refill:  0    No orders of the defined types were placed in this encounter.     Diagnoses and all orders for this visit:  Sinobronchitis  Other orders -     amoxicillin-clavulanate (AUGMENTIN) 875-125 MG tablet; Take 1 tablet by mouth 2 (two) times daily. Take all of this medication -     Pseudoephedrine-Guaifenesin [phone] MG TB12; Take 1 tablet by mouth 2 (two) times daily. For congestion    Virtual Visit via telephone Note  I discussed the limitations, risks, security and privacy concerns of performing an evaluation and management service by telephone and the availability of in  person appointments. The patient was identified with two identifiers. Pt.expressed understanding and agreed to proceed. Pt. Is at home. Dr. Darlyn Read is in his office.  Follow Up Instructions:   I discussed the assessment and treatment plan with the patient. The patient was provided an opportunity to ask questions and all were answered. The patient agreed with the plan and demonstrated an understanding of the instructions.   The patient was advised to call back or seek an in-person evaluation if the symptoms worsen or if the condition fails to improve as anticipated.   Total minutes including chart review and phone contact time: 12   Follow up plan: Return if symptoms worsen or fail to improve.  Mechele Claude, MD [facility]

## 2021-08-06 ENCOUNTER — Telehealth: Payer: Self-pay | Admitting: Nurse Practitioner

## 2021-08-06 NOTE — Telephone Encounter (Signed)
°  Prescription Request  08/06/2021  Is this a "Controlled Substance" medicine? no  Have you seen your PCP in the last 2 weeks? yes  If YES, route message to pool  -  If NO, patient needs to be scheduled for appointment.  What is the name of the medication or equipment? Cough is worse and wife wants to know if dr Darlyn Read will call in prednisone  Have you contacted your pharmacy to request a refill? no   Which pharmacy would you like this sent to? [facility]   Patient notified that their request is being sent to the clinical staff for review and that they should receive a response within 2 business days.

## 2021-08-21 ENCOUNTER — Ambulatory Visit: Payer: 59 | Admitting: Nurse Practitioner

## 2021-08-21 ENCOUNTER — Encounter: Payer: Self-pay | Admitting: Nurse Practitioner

## 2021-08-21 ENCOUNTER — Ambulatory Visit (INDEPENDENT_AMBULATORY_CARE_PROVIDER_SITE_OTHER): Payer: 59

## 2021-08-21 VITALS — BP 134/75 | HR 63 | Temp 98.8°F | Ht 73.0 in | Wt 253.0 lb

## 2021-08-21 DIAGNOSIS — R062 Wheezing: Secondary | ICD-10-CM | POA: Diagnosis not present

## 2021-08-21 DIAGNOSIS — R058 Other specified cough: Secondary | ICD-10-CM

## 2021-08-21 MED ORDER — PREDNISONE 10 MG (21) PO TBPK: ORAL_TABLET | ORAL | 0 refills | Status: DC

## 2021-08-21 NOTE — Patient Instructions (Signed)

## 2021-08-21 NOTE — Progress Notes (Signed)
Acute Office Visit  Subjective:    Patient ID: Donald Reeves, male    DOB: 08-19-1965, 56 y.o.   MRN: [id number]  Chief Complaint  Patient presents with   Cough    Persistent cough Wheezing Tightness in chest    Cough This is a new problem. The current episode started in the past 7 days. The problem has been gradually worsening. The problem occurs constantly. The cough is Productive of sputum. Associated symptoms include nasal congestion and a sore throat. Pertinent negatives include no chest pain, chills, fever, headaches, heartburn or sweats. Nothing aggravates the symptoms.    Past Medical History:  Diagnosis Date   Anxiety     Past Surgical History:  Procedure Laterality Date   EYE SURGERY     HERNIA REPAIR      Family History  Adopted: Yes  Problem Relation Age of Onset   Cancer Father     Social History   Socioeconomic History   Marital status: Married    Spouse name: Not on file   Number of children: Not on file   Years of education: Not on file   Highest education level: Not on file  Occupational History   Not on file  Tobacco Use   Smoking status: Every Day    Packs/day: 1.00    Years: 30.00    Pack years: 30.00    Types: Cigarettes   Smokeless tobacco: Never   Tobacco comments:    ready to quit  Substance and Sexual Activity   Alcohol use: No   Drug use: No   Sexual activity: Not on file  Other Topics Concern   Not on file  Social History Narrative   Not on file   Social Determinants of Health   Financial Resource Strain: Not on file  Food Insecurity: Not on file  Transportation Needs: Not on file  Physical Activity: Not on file  Stress: Not on file  Social Connections: Not on file  Intimate Partner Violence: Not on file    Outpatient Medications Prior to Visit  Medication Sig Dispense Refill   albuterol (VENTOLIN HFA) 108 (90 Base) MCG/ACT inhaler Inhale 2 puffs into the lungs every 6 (six) hours as needed for wheezing or  shortness of breath. 6.7 each 1   atorvastatin (LIPITOR) 40 MG tablet Take 1 tablet (40 mg total) by mouth daily. (NEEDS TO BE SEEN BEFORE NEXT REFILL) 90 tablet 1   benzonatate (TESSALON PERLES) 100 MG capsule Take 1 capsule (100 mg total) by mouth 3 (three) times daily as needed. 20 capsule 0   [facility] MUCUS D EXTENDED RELEASE 60-600 MG 12 hr tablet Take 2 tablets by mouth 2 (two) times daily.     escitalopram (LEXAPRO) 20 MG tablet Take 1 tablet (20 mg total) by mouth daily. (Needs to be seen before next refill) 90 tablet 1   furosemide (LASIX) 20 MG tablet Take 1 tablet (20 mg total) by mouth daily. 90 tablet 1   olmesartan (BENICAR) 20 MG tablet Take 1 tablet (20 mg total) by mouth daily. 90 tablet 1   olmesartan-hydrochlorothiazide (BENICAR HCT) 20-12.5 MG tablet Take 1 tablet by mouth daily.     amoxicillin-clavulanate (AUGMENTIN) 875-125 MG tablet Take 1 tablet by mouth 2 (two) times daily. Take all of this medication 20 tablet 0   Pseudoephedrine-Guaifenesin (939) 660-0027 MG TB12 Take 1 tablet by mouth 2 (two) times daily. For congestion 20 tablet 0   No facility-administered medications prior to visit.  No Known Allergies  Review of Systems  Constitutional:  Negative for chills and fever.  HENT:  Positive for congestion and sore throat.   Eyes: Negative.   Respiratory:  Positive for cough.   Cardiovascular:  Negative for chest pain.  Gastrointestinal:  Negative for heartburn.  Neurological:  Negative for headaches.  All other systems reviewed and are negative.     Objective:    Physical Exam Vitals and nursing note reviewed.  Constitutional:      Appearance: Normal appearance.  HENT:     Head: Normocephalic.     Right Ear: Ear canal and external ear normal.     Nose: Congestion present.     Mouth/Throat:     Mouth: Mucous membranes are moist.     Pharynx: Oropharynx is clear.  Eyes:     Conjunctiva/sclera: Conjunctivae normal.  Cardiovascular:     Rate and Rhythm: Normal  rate and regular rhythm.     Pulses: Normal pulses.     Heart sounds: Normal heart sounds.  Pulmonary:     Effort: Pulmonary effort is normal.     Breath sounds: Normal breath sounds.  Abdominal:     General: Bowel sounds are normal.  Musculoskeletal:        General: Normal range of motion.  Skin:    General: Skin is warm.     Findings: No rash.  Neurological:     General: No focal deficit present.     Mental Status: He is alert and oriented to person, place, and time.    BP 134/75    Pulse 63    Temp 98.8 F (37.1 C)    Ht '6\' 1"'  (1.854 m)    Wt 253 lb (114.8 kg)    SpO2 97%    BMI 33.38 kg/m  Wt Readings from Last 3 Encounters:  08/21/21 253 lb (114.8 kg)  04/10/21 254 lb (115.2 kg)  01/16/21 252 lb (114.3 kg)    Health Maintenance Due  Topic Date Due   COVID-19 Vaccine (1) Never done   HIV Screening  Never done   Hepatitis C Screening  Never done   Zoster Vaccines- Shingrix (1 of 2) Never done    There are no preventive care reminders to display for this patient.   Lab Results  Component Value Date   TSH 2.380 12/17/2016   Lab Results  Component Value Date   WBC 8.3 01/16/2021   HGB 14.1 01/16/2021   HCT 40.4 01/16/2021   MCV 88 01/16/2021   PLT 199 01/16/2021   Lab Results  Component Value Date   NA 137 01/16/2021   K 4.1 01/16/2021   CO2 25 01/16/2021   GLUCOSE 90 01/16/2021   BUN 15 01/16/2021   CREATININE 0.93 01/16/2021   BILITOT 0.4 01/16/2021   ALKPHOS 79 01/16/2021   AST 13 01/16/2021   ALT 19 01/16/2021   PROT 7.3 01/16/2021   ALBUMIN 4.5 01/16/2021   CALCIUM 9.3 01/16/2021   EGFR 98 01/16/2021   Lab Results  Component Value Date   CHOL 123 01/16/2021   Lab Results  Component Value Date   HDL 32 (L) 01/16/2021   Lab Results  Component Value Date   LDLCALC 66 01/16/2021   Lab Results  Component Value Date   TRIG 139 01/16/2021   Lab Results  Component Value Date   CHOLHDL 3.8 01/16/2021   No results found for: HGBA1C      Assessment & Plan:  Take meds as  prescribed - Use a cool mist humidifier  -Use saline nose sprays frequently -Force fluids -For fever or aches or pains- take Tylenol or ibuprofen. -Prednisone taper -Complete a chest x-ray to rule out pneumonia. -At home COVID-19 test negative. -If symptoms do not improve, she may need to be COVID tested to rule this out  Follow up with worsening unresolved symptoms  Problem List Items Addressed This Visit   None Visit Diagnoses     Wheezing    -  Primary   Relevant Medications   predniSONE (STERAPRED UNI-PAK 21 TAB) 10 MG (21) TBPK tablet   Other Relevant Orders   DG Chest 2 View   Other cough       Relevant Medications   predniSONE (STERAPRED UNI-PAK 21 TAB) 10 MG (21) TBPK tablet   Other Relevant Orders   DG Chest 2 View        Meds ordered this encounter  Medications   predniSONE (STERAPRED UNI-PAK 21 TAB) 10 MG (21) TBPK tablet    Sig: 6 tablet day 1, 5 tablet day 2, 4 tablets day 3, 3 tablet day 4, 2 tablet day 5, 1 tablet day 6    Dispense:  1 each    Refill:  0    Order Specific Question:   Supervising Provider    AnswerJeneen Rinks     Ivy Lynn, NP

## 2021-08-23 ENCOUNTER — Telehealth: Payer: Self-pay | Admitting: Nurse Practitioner

## 2021-08-23 NOTE — Telephone Encounter (Signed)
NA   Need more information - what symptoms? Are any better?

## 2021-08-24 ENCOUNTER — Other Ambulatory Visit: Payer: Self-pay | Admitting: Nurse Practitioner

## 2021-08-24 MED ORDER — AZITHROMYCIN 250 MG PO TABS: ORAL_TABLET | ORAL | 0 refills | Status: AC

## 2021-08-24 NOTE — Telephone Encounter (Signed)
Left messge - rx sent to pharmacy

## 2021-08-24 NOTE — Telephone Encounter (Signed)
Patient had a visit on 2/14 and states he is still having bad cough and congestion.  Requesting abx be sent in. Please advise

## 2021-09-02 ENCOUNTER — Other Ambulatory Visit: Payer: Self-pay | Admitting: Nurse Practitioner

## 2021-09-02 DIAGNOSIS — E7800 Pure hypercholesterolemia, unspecified: Secondary | ICD-10-CM

## 2021-09-27 ENCOUNTER — Other Ambulatory Visit: Payer: Self-pay | Admitting: Nurse Practitioner

## 2021-09-27 DIAGNOSIS — E7800 Pure hypercholesterolemia, unspecified: Secondary | ICD-10-CM

## 2021-09-27 DIAGNOSIS — R609 Edema, unspecified: Secondary | ICD-10-CM

## 2021-09-30 ENCOUNTER — Other Ambulatory Visit: Payer: Self-pay | Admitting: Nurse Practitioner

## 2021-09-30 DIAGNOSIS — I10 Essential (primary) hypertension: Secondary | ICD-10-CM

## 2021-10-10 ENCOUNTER — Other Ambulatory Visit: Payer: Self-pay | Admitting: Nurse Practitioner

## 2021-10-10 DIAGNOSIS — F410 Panic disorder [episodic paroxysmal anxiety] without agoraphobia: Secondary | ICD-10-CM

## 2021-10-10 NOTE — Telephone Encounter (Signed)
Appointment made for 4/11. ?

## 2021-10-10 NOTE — Telephone Encounter (Signed)
MMM NTBS 30 days given 09/11/21 ?

## 2021-10-16 ENCOUNTER — Encounter: Payer: Self-pay | Admitting: Nurse Practitioner

## 2021-10-16 ENCOUNTER — Ambulatory Visit: Payer: 59 | Admitting: Nurse Practitioner

## 2021-10-16 VITALS — BP 106/68 | HR 66 | Temp 98.7°F | Resp 20 | Ht 73.0 in | Wt 259.0 lb

## 2021-10-16 DIAGNOSIS — F411 Generalized anxiety disorder: Secondary | ICD-10-CM | POA: Diagnosis not present

## 2021-10-16 DIAGNOSIS — F410 Panic disorder [episodic paroxysmal anxiety] without agoraphobia: Secondary | ICD-10-CM | POA: Diagnosis not present

## 2021-10-16 DIAGNOSIS — Z125 Encounter for screening for malignant neoplasm of prostate: Secondary | ICD-10-CM

## 2021-10-16 DIAGNOSIS — R609 Edema, unspecified: Secondary | ICD-10-CM

## 2021-10-16 DIAGNOSIS — F17200 Nicotine dependence, unspecified, uncomplicated: Secondary | ICD-10-CM

## 2021-10-16 DIAGNOSIS — E7800 Pure hypercholesterolemia, unspecified: Secondary | ICD-10-CM

## 2021-10-16 DIAGNOSIS — I10 Essential (primary) hypertension: Secondary | ICD-10-CM | POA: Diagnosis not present

## 2021-10-16 DIAGNOSIS — Z6833 Body mass index (BMI) 33.0-33.9, adult: Secondary | ICD-10-CM

## 2021-10-16 MED ORDER — ESCITALOPRAM OXALATE 20 MG PO TABS: 20.0000 mg | ORAL_TABLET | Freq: Every day | ORAL | 1 refills | Status: DC

## 2021-10-16 MED ORDER — FUROSEMIDE 20 MG PO TABS: 20.0000 mg | ORAL_TABLET | Freq: Every day | ORAL | 1 refills | Status: DC

## 2021-10-16 MED ORDER — OLMESARTAN MEDOXOMIL 20 MG PO TABS: 20.0000 mg | ORAL_TABLET | Freq: Every day | ORAL | 1 refills | Status: DC

## 2021-10-16 MED ORDER — ATORVASTATIN CALCIUM 40 MG PO TABS: 40.0000 mg | ORAL_TABLET | Freq: Every day | ORAL | 1 refills | Status: DC

## 2021-10-16 NOTE — Progress Notes (Signed)
? ?Subjective:  ? ? Patient ID: Donald Reeves, male    DOB: 1965-12-25, 56 y.o.   MRN: [id number] ? ?Chief Complaint: medical management of chronic issues  ?  ? ?HPI: ? ?Donald Reeves is a 56 y.o. who identifies as a male who was assigned male at birth.  ? 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 ? ?Comes in today for follow up of the following chronic medical issues: ? ?1. Primary hypertension ?No  c/o chest pain, sob or headache. Does not check blood pressure at home ?BP Readings from Last 3 Encounters:  ?08/21/21 134/75  ?04/10/21 (!) 91/55  ?01/16/21 107/69  ? ? ? ?2. Pure hypercholesterolemia ?Does try to watch diet but does little to no exercise. ?Lab Results  ?Component Value Date  ? CHOL 123 01/16/2021  ? HDL 32 (L) 01/16/2021  ? [place] 66 01/16/2021  ? TRIG 139 01/16/2021  ? CHOLHDL 3.8 01/16/2021  ? ?The ASCVD Risk score (Arnett DK, et al., 2019) failed to calculate for the following reasons: ?  The valid total cholesterol range is 130 to 320 mg/dL ? ? ?3. Panic attacks ?Is on lexapro and is doing well. No recent panic attacks ? ?  10/16/2021  ? 10:18 AM 08/21/2021  ? 12:13 PM 04/10/2021  ?  8:40 AM 01/16/2021  ?  8:42 AM  ?GAD 7 : Generalized Anxiety Score  ?Nervous, Anxious, on Edge 0 0 0 1  ?Control/stop worrying 0 0 0 1  ?Worry too much - different things 0 0 0 1  ?Trouble relaxing 0 0 0 1  ?Restless 0 0 0 0  ?Easily annoyed or irritable 0 0 0 0  ?Afraid - awful might happen 0 0 0 0  ?Total GAD 7 Score 0 0 0 4  ?Anxiety Difficulty Not difficult at all Not difficult at all Not difficult at all Not difficult at all  ? ? ? ? ? ?4. GAD (generalized anxiety disorder) ?The lexapor really helps with his stress levels.  ? ?5. Smoker ?Smokes at least 1/2 pack a day. ? ?6. BMI 33.0-33.9,adult ?Weight is up 6 lbs ? ?Wt Readings from Last 3 Encounters:  ?10/16/21 259 lb (117.5 kg)  ?08/21/21 253 lb (114.8 kg)  ?04/10/21 254 lb (115.2 kg)  ? ?BMI Readings from Last 3 Encounters:   ?10/16/21 34.17 kg/m?  ?08/21/21 33.38 kg/m?  ?04/10/21 33.51 kg/m?  ? ? ? ?New complaints: ?None today ? ?No Known Allergies ?Outpatient Encounter Medications as of 10/16/2021  ?Medication Sig  ? albuterol (VENTOLIN HFA) 108 (90 Base) MCG/ACT inhaler Inhale 2 puffs into the lungs every 6 (six) hours as needed for wheezing or shortness of breath.  ? atorvastatin (LIPITOR) 40 MG tablet Take 1 tablet (40 mg total) by mouth daily.  ? benzonatate (TESSALON PERLES) 100 MG capsule Take 1 capsule (100 mg total) by mouth 3 (three) times daily as needed.  ? [facility] MUCUS D EXTENDED RELEASE 60-600 MG 12 hr tablet Take 2 tablets by mouth 2 (two) times daily.  ? escitalopram (LEXAPRO) 20 MG tablet Take 1 tablet (20 mg total) by mouth daily. (Needs to be seen before next refill)  ? furosemide (LASIX) 20 MG tablet TAKE 1 TABLET BY MOUTH EVERY DAY  ? olmesartan (BENICAR) 20 MG tablet TAKE 1 TABLET BY MOUTH EVERY DAY  ? olmesartan-hydrochlorothiazide (BENICAR HCT) 20-12.5 MG tablet Take 1 tablet by mouth daily.  ? predniSONE (STERAPRED UNI-PAK 21 TAB) 10 MG (21) TBPK tablet 6 tablet  day 1, 5 tablet day 2, 4 tablets day 3, 3 tablet day 4, 2 tablet day 5, 1 tablet day 6  ? ?No facility-administered encounter medications on file as of 10/16/2021.  ? ? ?Past Surgical History:  ?Procedure Laterality Date  ? EYE SURGERY    ? HERNIA REPAIR    ? ? ?Family History  ?Adopted: Yes  ?Problem Relation Age of Onset  ? Cancer Father   ? ? ? ? ?Controlled substance contract: n/a ? ? ? ? ?Review of Systems  ?Constitutional:  Negative for diaphoresis.  ?Eyes:  Negative for pain.  ?Respiratory:  Negative for shortness of breath.   ?Cardiovascular:  Negative for chest pain, palpitations and leg swelling.  ?Gastrointestinal:  Negative for abdominal pain.  ?Endocrine: Negative for polydipsia.  ?Skin:  Negative for rash.  ?Neurological:  Negative for dizziness, weakness and headaches.  ?Hematological:  Does not bruise/bleed easily.  ?All other systems  reviewed and are negative. ? ?   ?Objective:  ? Physical Exam ?Vitals and nursing note reviewed.  ?Constitutional:   ?   Appearance: Normal appearance. He is well-developed.  ?HENT:  ?   Head: Normocephalic.  ?   Nose: Nose normal.  ?   Mouth/Throat:  ?   Mouth: Mucous membranes are moist.  ?   Pharynx: Oropharynx is clear.  ?Eyes:  ?   Pupils: Pupils are equal, round, and reactive to light.  ?Neck:  ?   Thyroid: No thyroid mass or thyromegaly.  ?   Vascular: No carotid bruit or JVD.  ?   Trachea: Phonation normal.  ?Cardiovascular:  ?   Rate and Rhythm: Normal rate and regular rhythm.  ?Pulmonary:  ?   Effort: Pulmonary effort is normal. No respiratory distress.  ?   Breath sounds: Normal breath sounds.  ?Abdominal:  ?   General: Bowel sounds are normal.  ?   Palpations: Abdomen is soft.  ?   Tenderness: There is no abdominal tenderness.  ?Musculoskeletal:     ?   General: Normal range of motion.  ?   Cervical back: Normal range of motion and neck supple.  ?Lymphadenopathy:  ?   Cervical: No cervical adenopathy.  ?Skin: ?   General: Skin is warm and dry.  ?Neurological:  ?   Mental Status: He is alert and oriented to person, place, and time.  ?Psychiatric:     ?   Behavior: Behavior normal.     ?   Thought Content: Thought content normal.     ?   Judgment: Judgment normal.  ? ?BP 106/68   Pulse 66   Temp 98.7 ?F (37.1 ?C) (Temporal)   Resp 20   Ht 6\' 1"  (1.854 m)   Wt 259 lb (117.5 kg)   SpO2 95%   BMI 34.17 kg/m?  ? ? ? ? ?   ?Assessment & Plan:  ?Donald Reeves Suite comes in today with chief complaint of Medical Management of Chronic Issues ? ? ?Diagnosis and orders addressed: ? ?1. Primary hypertension ?Low sodium diet ?- olmesartan (BENICAR) 20 MG tablet; Take 1 tablet (20 mg total) by mouth daily.  Dispense: 90 tablet; Refill: 1 ? ?2. Pure hypercholesterolemia ?Low fat diet ?- atorvastatin (LIPITOR) 40 MG tablet; Take 1 tablet (40 mg total) by mouth daily.  Dispense: 90 tablet; Refill: 1 ? ?3. Panic  attacks ?Stress management ?- escitalopram (LEXAPRO) 20 MG tablet; Take 1 tablet (20 mg total) by mouth daily. (Needs to be seen before next refill)  Dispense: 90 tablet; Refill: 1 ? ?4. GAD (generalized anxiety disorder) ? ?5. Smoker ?- CT CHEST LUNG CA SCREEN LOW DOSE W/O CM; Future ? ?6. BMI 33.0-33.9,adult ?Discussed diet and exercise for person with BMI >25 ?Will recheck weight in 3-6 months ? ? ?7. Peripheral edema ?Elevate legs when siting ?- furosemide (LASIX) 20 MG tablet; Take 1 tablet (20 mg total) by mouth daily.  Dispense: 90 tablet; Refill: 1 ? ? ?Labs pending ?Health Maintenance reviewed ?Diet and exercise encouraged ? ?Follow up plan: ?6 months ? ? ?Mary-Margaret Hassell Done, FNP ? ? ?

## 2021-10-16 NOTE — Patient Instructions (Signed)
Stress, Adult ?Stress is a normal reaction to life events. Stress is what you feel when life demands more than you are used to, or more than you think you can handle. ?Some stress can be useful, such as studying for a test or meeting a deadline at work. Stress that occurs too often or for too long can cause problems. Long-lasting stress is called chronic stress. Chronic stress can affect your emotional health and interfere with relationships and normal daily activities. ?Too much stress can weaken your body's defense system (immune system) and increase your risk for physical illness. If you already have a medical problem, stress can make it worse. ?What are the causes? ?All sorts of life events can cause stress. An event that causes stress for one person may not be stressful for someone else. Major life events, whether positive or negative, commonly cause stress. Examples include: ?Losing a job or starting a new job. ?Losing a loved one. ?Moving to a new town or home. ?Getting married or divorced. ?Having a baby. ?Getting injured or sick. ?Less obvious life events can also cause stress, especially if they occur day after day or in combination with each other. Examples include: ?Working long hours. ?Driving in traffic. ?Caring for children. ?Being in debt. ?Being in a difficult relationship. ?What are the signs or symptoms? ?Stress can cause emotional and physical symptoms and can lead to unhealthy behaviors. These include the following: ?Emotional symptoms ?Anxiety. This is feeling worried, afraid, on edge, overwhelmed, or out of control. ?Anger, including irritation or impatience. ?Depression. This is feeling sad, down, helpless, or guilty. ?Trouble focusing, remembering, or making decisions. ?Physical symptoms ?Aches and pains. These may affect your head, neck, back, stomach, or other areas of your body. ?Tight muscles or a clenched jaw. ?Low energy. ?Trouble sleeping. ?Unhealthy behaviors ?Eating to feel better  (overeating) or skipping meals. ?Working too much or putting off tasks. ?Smoking, drinking alcohol, or using drugs to feel better. ?How is this diagnosed? ?A stress disorder is diagnosed through an assessment by your health care provider. A stress disorder may be diagnosed based on: ?Your symptoms and any stressful life events. ?Your medical history. ?Tests to rule out other causes of your symptoms. ?Depending on your condition, your health care provider may refer you to a specialist for further evaluation. ?How is this treated? ?Stress management techniques are the recommended treatment for stress. Medicine is not typically recommended for treating stress. ?Techniques to reduce your reaction to stressful life events include: ?Identifying stress. Monitor yourself for symptoms of stress and notice what causes stress for you. These skills may help you to avoid or prepare for stressful events. ?Managing time. Set your priorities, keep a calendar of events, and learn to say no. These actions can help you avoid taking on too much. ?Techniques for dealing with stress include: ?Rethinking the problem. Try to think realistically about stressful events rather than ignoring them or overreacting. Try to find the positives in a stressful situation rather than focusing on the negatives. ?Exercise. Physical exercise can release both physical and emotional tension. The key is to find a form of exercise that you enjoy and do it regularly. ?Relaxation techniques. These relax the body and mind. Find one or more that you enjoy and use the techniques regularly. Examples include: ?Meditation, deep breathing, or progressive relaxation techniques. ?Yoga or tai chi. ?Biofeedback, mindfulness techniques, or journaling. ?Listening to music, being in nature, or taking part in other hobbies. ?Practicing a healthy lifestyle. Eat a balanced diet,  drink plenty of water, limit or avoid caffeine, and get plenty of sleep. ?Having a strong support  network. Spend time with family, friends, or other people you enjoy being around. Express your feelings and talk things over with someone you trust. ?Counseling or talk therapy with a mental health provider may help if you are having trouble managing stress by yourself. ?Follow these instructions at home: ?Lifestyle ? ?Avoid drugs. ?Do not use any products that contain nicotine or tobacco. These products include cigarettes, chewing tobacco, and vaping devices, such as e-cigarettes. If you need help quitting, ask your health care provider. ?If you drink alcohol: ?Limit how much you have to: ?0-1 drink a day for women who are not pregnant. ?0-2 drinks a day for men. ?Know how much alcohol is in a drink. In the [place] one drink equals one 12 oz bottle of beer (355 mL), one 5 oz glass of wine (148 mL), or one 1? oz glass of hard liquor (44 mL). ?Do not use alcohol or drugs to relax. ?Eat a balanced diet that includes fresh fruits and vegetables, whole grains, lean meats, fish, eggs, beans, and low-fat dairy. Avoid processed foods and foods high in added fat, sugar, and salt. ?Exercise at least 30 minutes on 5 or more days each week. ?Get 7-8 hours of sleep each night. ?General instructions ? ?Practice stress management techniques as told by your health care provider. ?Drink enough fluid to keep your urine pale yellow. ?Take over-the-counter and prescription medicines only as told by your health care provider. ?Keep all follow-up visits. This is important. ?Contact a health care provider if: ?Your symptoms get worse. ?You have new symptoms. ?You feel overwhelmed by your problems and can no longer manage them by yourself. ?Get help right away if: ?You have thoughts of hurting yourself or others. ?Get help right awayif you feel like you may hurt yourself or others, or have thoughts about taking your own life. Go to your nearest emergency room or: ?Call 911. ?Call the [facility] [place] at [phone] or  988. This is open 24 hours a day. ?Text the Crisis Text Line at 782 246 6046. ?Summary ?Stress is a normal reaction to life events. It can cause problems if it happens too often or for too long. ?Practicing stress management techniques is the best way to treat stress. ?Counseling or talk therapy with a mental health provider may help if you are having trouble managing stress by yourself. ?This information is not intended to replace advice given to you by your health care provider. Make sure you discuss any questions you have with your health care provider. ?Document Revised: 02/01/2021 Document Reviewed: 02/01/2021 ?Elsevier Patient Education ? [place]. ? ?

## 2021-10-16 NOTE — Addendum Note (Signed)
Addended by: Bennie Pierini on: 10/16/2021 10:36 AM ? ? Modules accepted: Orders ? ?

## 2021-10-17 LAB — CMP14+EGFR
ALT: 24 IU/L (ref 0–44)
AST: 21 IU/L (ref 0–40)
Albumin/Globulin Ratio: 2.1 (ref 1.2–2.2)
Albumin: 4.5 g/dL (ref 3.8–4.9)
Alkaline Phosphatase: 100 IU/L (ref 44–121)
BUN/Creatinine Ratio: 16 (ref 9–20)
BUN: 11 mg/dL (ref 6–24)
Bilirubin Total: 0.4 mg/dL (ref 0.0–1.2)
CO2: 23 mmol/L (ref 20–29)
Calcium: 9.4 mg/dL (ref 8.7–10.2)
Chloride: 97 mmol/L (ref 96–106)
Creatinine, Ser: 0.7 mg/dL — ABNORMAL LOW (ref 0.76–1.27)
Globulin, Total: 2.1 g/dL (ref 1.5–4.5)
Glucose: 92 mg/dL (ref 70–99)
Potassium: 4 mmol/L (ref 3.5–5.2)
Sodium: 136 mmol/L (ref 134–144)
Total Protein: 6.6 g/dL (ref 6.0–8.5)
eGFR: 109 mL/min/{1.73_m2} (ref >59)

## 2021-10-17 LAB — CBC WITH DIFFERENTIAL/PLATELET
Basophils Absolute: 0.1 10*3/uL (ref 0.0–0.2)
Basos: 1 %
EOS (ABSOLUTE): 0.3 10*3/uL (ref 0.0–0.4)
Eos: 4 %
Hematocrit: 42.4 % (ref 37.5–51.0)
Hemoglobin: 14.5 g/dL (ref 13.0–17.7)
Immature Grans (Abs): 0 10*3/uL (ref 0.0–0.1)
Immature Granulocytes: 0 %
Lymphocytes Absolute: 1.8 10*3/uL (ref 0.7–3.1)
Lymphs: 24 %
MCH: 30.7 pg (ref 26.6–33.0)
MCHC: 34.2 g/dL (ref 31.5–35.7)
MCV: 90 fL (ref 79–97)
Monocytes Absolute: 0.6 10*3/uL (ref 0.1–0.9)
Monocytes: 8 %
Neutrophils Absolute: 4.8 10*3/uL (ref 1.4–7.0)
Neutrophils: 63 %
Platelets: 198 10*3/uL (ref 150–450)
RBC: 4.73 x10E6/uL (ref 4.14–5.80)
RDW: 12.2 % (ref 11.6–15.4)
WBC: 7.6 10*3/uL (ref 3.4–10.8)

## 2021-10-17 LAB — LIPID PANEL
Chol/HDL Ratio: 3.9 ratio (ref 0.0–5.0)
Cholesterol, Total: 130 mg/dL (ref 100–199)
HDL: 33 mg/dL — ABNORMAL LOW (ref >39)
LDL Chol Calc (NIH): 66 mg/dL (ref 0–99)
Triglycerides: 181 mg/dL — ABNORMAL HIGH (ref 0–149)
VLDL Cholesterol Cal: 31 mg/dL (ref 5–40)

## 2021-10-17 LAB — PSA, TOTAL AND FREE
PSA, Free Pct: 41.4 %
PSA, Free: 0.29 ng/mL
Prostate Specific Ag, Serum: 0.7 ng/mL (ref 0.0–4.0)

## 2021-11-21 ENCOUNTER — Encounter (HOSPITAL_COMMUNITY): Payer: Self-pay

## 2021-11-21 NOTE — Progress Notes (Signed)
[facility] referral received. Attempted to reach patient but was unable to do so. Detailed VM left asking that the patient return my call. ? ?

## 2022-01-07 ENCOUNTER — Encounter: Payer: Self-pay | Admitting: Nurse Practitioner

## 2022-01-07 ENCOUNTER — Telehealth (INDEPENDENT_AMBULATORY_CARE_PROVIDER_SITE_OTHER): Payer: 59 | Admitting: Nurse Practitioner

## 2022-01-07 DIAGNOSIS — R609 Edema, unspecified: Secondary | ICD-10-CM | POA: Diagnosis not present

## 2022-01-07 MED ORDER — FUROSEMIDE 40 MG PO TABS: 40.0000 mg | ORAL_TABLET | Freq: Every day | ORAL | 3 refills | Status: DC

## 2022-01-07 NOTE — Patient Instructions (Signed)
Peripheral Edema  Peripheral edema is swelling that is caused by a buildup of fluid. Peripheral edema most often affects the lower legs, ankles, and feet. It can also develop in the arms, hands, and face. The area of the body that has peripheral edema will look swollen. It may also feel heavy or warm. Your clothes may start to feel tight. Pressing on the area may make a temporary dent in your skin (pitting edema). You may not be able to move your swollen arm or leg as much as usual. There are many causes of peripheral edema. It can happen because of a complication of other conditions such as heart failure, kidney disease, or a problem with your circulation. It also can be a side effect of certain medicines or happen because of an infection. It often happens to women during pregnancy. Sometimes, the cause is not known. Follow these instructions at home: Managing pain, stiffness, and swelling  Raise (elevate) your legs while you are sitting or lying down. Move around often to prevent stiffness and to reduce swelling. Do not sit or stand for long periods of time. Do not wear tight clothing. Do not wear garters on your upper legs. Exercise your legs to get your circulation going. This helps to move the fluid back into your blood vessels, and it may help the swelling go down. Wear compression stockings as told by your health care provider. These stockings help to prevent blood clots and reduce swelling in your legs. It is important that these are the correct size. These stockings should be prescribed by your doctor to prevent possible injuries. If elastic bandages or wraps are recommended, use them as told by your health care provider. Medicines Take over-the-counter and prescription medicines only as told by your health care provider. Your health care provider may prescribe medicine to help your body get rid of excess water (diuretic). Take this medicine if you are told to take it. General  instructions Eat a low-salt (low-sodium) diet as told by your health care provider. Sometimes, eating less salt may reduce swelling. Pay attention to any changes in your symptoms. Moisturize your skin daily to help prevent skin from cracking and draining. Keep all follow-up visits. This is important. Contact a health care provider if: You have a fever. You have swelling in only one leg. You have increased swelling, redness, or pain in one or both of your legs. You have drainage or sores at the area where you have edema. Get help right away if: You have edema that starts suddenly or is getting worse, especially if you are pregnant or have a medical condition. You develop shortness of breath, especially when you are lying down. You have pain in your chest or abdomen. You feel weak. You feel like you will faint. These symptoms may be an emergency. Get help right away. Call 911. Do not wait to see if the symptoms will go away. Do not drive yourself to the hospital. Summary Peripheral edema is swelling that is caused by a buildup of fluid. Peripheral edema most often affects the lower legs, ankles, and feet. Move around often to prevent stiffness and to reduce swelling. Do not sit or stand for long periods of time. Pay attention to any changes in your symptoms. Contact a health care provider if you have edema that starts suddenly or is getting worse, especially if you are pregnant or have a medical condition. Get help right away if you develop shortness of breath, especially when lying down.   This information is not intended to replace advice given to you by your health care provider. Make sure you discuss any questions you have with your health care provider. Document Revised: 02/26/2021 Document Reviewed: 02/26/2021 Elsevier Patient Education  2023 Elsevier Inc.  

## 2022-01-07 NOTE — Progress Notes (Signed)
Virtual Visit Consent   Donald Reeves, you are scheduled for a virtual visit with [name], FNP, a [facility] provider, today.     Just as with appointments in the office, your consent must be obtained to participate.  Your consent will be active for this visit and any virtual visit you may have with one of our providers in the next 365 days.     If you have a MyChart account, a copy of this consent can be sent to you electronically.  All virtual visits are billed to your insurance company just like a traditional visit in the office.    As this is a virtual visit, video technology does not allow for your provider to perform a traditional examination.  This may limit your provider's ability to fully assess your condition.  If your provider identifies any concerns that need to be evaluated in person or the need to arrange testing (such as labs, EKG, etc.), we will make arrangements to do so.     Although advances in technology are sophisticated, we cannot ensure that it will always work on either your end or our end.  If the connection with a video visit is poor, the visit may have to be switched to a telephone visit.  With either a video or telephone visit, we are not always able to ensure that we have a secure connection.     I need to obtain your verbal consent now.   Are you willing to proceed with your visit today? YES   Angelica Wix has provided verbal consent on 01/07/2022 for a virtual visit (video or telephone).   [name], FNP   Date: 01/07/2022 10:04 AM   Virtual Visit via Video Note   I, [name], connected with Donald Reeves ([id number], 09-27-1965) on 01/07/22 at  4:30 PM EDT by a video-enabled telemedicine application and verified that I am speaking with the correct person using two identifiers.  Location: Patient: Virtual Visit Location Patient: Home Provider: Virtual Visit Location Provider: Mobile   I discussed the limitations of  evaluation and management by telemedicine and the availability of in person appointments. The patient expressed understanding and agreed to proceed.    History of Present Illness: Donald Reeves is a 56 y.o. who identifies as a male who was assigned male at birth, and is being seen today for peripheral edema.  HPI: Patient was seen recently with peripheral edema. He was put on lasix. He is still having swelling.    Review of Systems  Constitutional:  Negative for diaphoresis and weight loss.  Eyes:  Negative for blurred vision, double vision and pain.  Respiratory:  Negative for shortness of breath.   Cardiovascular:  Positive for leg swelling. Negative for chest pain, palpitations and orthopnea.  Gastrointestinal:  Negative for abdominal pain.  Skin:  Negative for rash.  Neurological:  Negative for dizziness, sensory change, loss of consciousness, weakness and headaches.  Endo/Heme/Allergies:  Negative for polydipsia. Does not bruise/bleed easily.  Psychiatric/Behavioral:  Negative for memory loss. The patient does not have insomnia.   All other systems reviewed and are negative.   Problems:  Patient Active Problem List   Diagnosis Date Noted   BMI 33.0-33.9,adult 12/14/2019   Hyperlipidemia 12/14/2019   Smoker 03/26/2018   HTN (hypertension) 12/24/2016   Panic attacks 09/09/2013   GAD (generalized anxiety disorder) 09/09/2013    Allergies: No Known Allergies Medications:  Current Outpatient Medications:    albuterol (VENTOLIN HFA) 108 (90 Base)  MCG/ACT inhaler, Inhale 2 puffs into the lungs every 6 (six) hours as needed for wheezing or shortness of breath., Disp: 6.7 each, Rfl: 1   atorvastatin (LIPITOR) 40 MG tablet, Take 1 tablet (40 mg total) by mouth daily., Disp: 90 tablet, Rfl: 1   escitalopram (LEXAPRO) 20 MG tablet, Take 1 tablet (20 mg total) by mouth daily. (Needs to be seen before next refill), Disp: 90 tablet, Rfl: 1   furosemide (LASIX) 20 MG tablet, Take 1 tablet  (20 mg total) by mouth daily., Disp: 90 tablet, Rfl: 1   olmesartan (BENICAR) 20 MG tablet, Take 1 tablet (20 mg total) by mouth daily., Disp: 90 tablet, Rfl: 1  Observations/Objective: Patient is well-developed, well-nourished in no acute distress.  Resting comfortably  at home.  Head is normocephalic, atraumatic.  No labored breathing.  Speech is clear and coherent with logical content.  Patient is alert and oriented at baseline.  Mild lower ext edema  Assessment and Plan:  Donald Reeves in today with chief complaint of peripheral edema  1. Peripheral edema Compression socks Elevate legs when sitting - furosemide (LASIX) 40 MG tablet; Take 1 tablet (40 mg total) by mouth daily.  Dispense: 30 tablet; Refill: 3    Follow Up Instructions: I discussed the assessment and treatment plan with the patient. The patient was provided an opportunity to ask questions and all were answered. The patient agreed with the plan and demonstrated an understanding of the instructions.  A copy of instructions were sent to the patient via MyChart.  The patient was advised to call back or seek an in-person evaluation if the symptoms worsen or if the condition fails to improve as anticipated.  Time:  I spent 6 min minutes with the patient via telehealth technology discussing the above problems/concerns.    [name], FNP

## 2022-03-17 ENCOUNTER — Other Ambulatory Visit: Payer: Self-pay | Admitting: Nurse Practitioner

## 2022-03-17 DIAGNOSIS — F410 Panic disorder [episodic paroxysmal anxiety] without agoraphobia: Secondary | ICD-10-CM

## 2022-03-19 ENCOUNTER — Other Ambulatory Visit: Payer: Self-pay | Admitting: *Deleted

## 2022-03-19 DIAGNOSIS — J209 Acute bronchitis, unspecified: Secondary | ICD-10-CM

## 2022-03-19 MED ORDER — ALBUTEROL SULFATE HFA 108 (90 BASE) MCG/ACT IN AERS: 2.0000 | INHALATION_SPRAY | Freq: Four times a day (QID) | RESPIRATORY_TRACT | 1 refills | Status: DC | PRN

## 2022-03-21 ENCOUNTER — Ambulatory Visit: Payer: 59 | Admitting: Family Medicine

## 2022-03-21 ENCOUNTER — Encounter: Payer: Self-pay | Admitting: Family Medicine

## 2022-03-21 DIAGNOSIS — J0110 Acute frontal sinusitis, unspecified: Secondary | ICD-10-CM | POA: Diagnosis not present

## 2022-03-21 MED ORDER — PREDNISONE 20 MG PO TABS: ORAL_TABLET | ORAL | 0 refills | Status: DC

## 2022-03-21 MED ORDER — AMOXICILLIN-POT CLAVULANATE 875-125 MG PO TABS: 1.0000 | ORAL_TABLET | Freq: Two times a day (BID) | ORAL | 0 refills | Status: DC

## 2022-03-21 NOTE — Progress Notes (Signed)
Virtual Visit via telephone Note  I connected with Donald Reeves on 03/21/22 at 1642 by telephone and verified that I am speaking with the correct person using two identifiers. Donald Reeves is currently located at home and patient are currently with her during visit. The provider, Elige Radon Demetria Iwai, MD is located in their office at time of visit.  Call ended at 1653  I discussed the limitations, risks, security and privacy concerns of performing an evaluation and management service by telephone and the availability of in person appointments. I also discussed with the patient that there may be a patient responsible charge related to this service. The patient expressed understanding and agreed to proceed.   History and Present Illness: Patient is calling in for sinus pressure and congestion and started 7 days ago.  He has a lot of congestion and sinus pressure and cough and chest congestion.  He has sinus drainage and pressure. He gets this about this time every year.  He has prednisone and antibiotics. He is using flonase some and tessalon perles and neither has helped much.  He denies fevers or chills.  He does have a little wheezing and sore throat. His wife is getting over bronchitis. She tested negative for covid. He is a smoker but does not have any known lung disease.  He did get albuterol and it helped some.   1. Acute non-recurrent frontal sinusitis     Outpatient Encounter Medications as of 03/21/2022  Medication Sig   amoxicillin-clavulanate (AUGMENTIN) 875-125 MG tablet Take 1 tablet by mouth 2 (two) times daily.   predniSONE (DELTASONE) 20 MG tablet 2 po at same time daily for 5 days   albuterol (VENTOLIN HFA) 108 (90 Base) MCG/ACT inhaler Inhale 2 puffs into the lungs every 6 (six) hours as needed for wheezing or shortness of breath.   atorvastatin (LIPITOR) 40 MG tablet Take 1 tablet (40 mg total) by mouth daily.   escitalopram (LEXAPRO) 20 MG tablet Take 1 tablet (20 mg  total) by mouth daily.   furosemide (LASIX) 40 MG tablet Take 1 tablet (40 mg total) by mouth daily.   olmesartan (BENICAR) 20 MG tablet Take 1 tablet (20 mg total) by mouth daily.   No facility-administered encounter medications on file as of 03/21/2022.    Review of Systems  Constitutional:  Negative for chills and fever.  HENT:  Positive for congestion, postnasal drip, rhinorrhea, sinus pressure, sneezing and sore throat. Negative for ear discharge, ear pain and voice change.   Eyes:  Negative for visual disturbance.  Respiratory:  Positive for cough and wheezing. Negative for shortness of breath.   Cardiovascular:  Negative for chest pain and leg swelling.  Musculoskeletal:  Negative for gait problem.  Skin:  Negative for rash.  All other systems reviewed and are negative.   Observations/Objective: Patient sounds comfortable.   Assessment and Plan: Problem List Items Addressed This Visit   None Visit Diagnoses     Acute non-recurrent frontal sinusitis    -  Primary   Relevant Medications   predniSONE (DELTASONE) 20 MG tablet   amoxicillin-clavulanate (AUGMENTIN) 875-125 MG tablet       Sounds like sinus infection, will treat with steroids to just because of his smoking history and concern for possible underlying chronic bronchitis versus emphysema. Follow up plan: Return if symptoms worsen or fail to improve.     I discussed the assessment and treatment plan with the patient. The patient was provided an opportunity to ask questions  and all were answered. The patient agreed with the plan and demonstrated an understanding of the instructions.   The patient was advised to call back or seek an in-person evaluation if the symptoms worsen or if the condition fails to improve as anticipated.  The above assessment and management plan was discussed with the patient. The patient verbalized understanding of and has agreed to the management plan. Patient is aware to call the clinic  if symptoms persist or worsen. Patient is aware when to return to the clinic for a follow-up visit. Patient educated on when it is appropriate to go to the emergency department.    I provided 11 minutes of non-face-to-face time during this encounter.    Nils Pyle, MD

## 2022-04-15 ENCOUNTER — Other Ambulatory Visit: Payer: Self-pay | Admitting: Nurse Practitioner

## 2022-04-15 DIAGNOSIS — E7800 Pure hypercholesterolemia, unspecified: Secondary | ICD-10-CM

## 2022-04-15 DIAGNOSIS — I10 Essential (primary) hypertension: Secondary | ICD-10-CM

## 2022-04-23 ENCOUNTER — Encounter: Payer: Self-pay | Admitting: Nurse Practitioner

## 2022-04-23 ENCOUNTER — Ambulatory Visit: Payer: 59 | Admitting: Nurse Practitioner

## 2022-04-23 VITALS — BP 99/63 | HR 67 | Temp 98.4°F | Resp 20 | Ht 73.0 in | Wt 255.0 lb

## 2022-04-23 DIAGNOSIS — F410 Panic disorder [episodic paroxysmal anxiety] without agoraphobia: Secondary | ICD-10-CM

## 2022-04-23 DIAGNOSIS — F411 Generalized anxiety disorder: Secondary | ICD-10-CM

## 2022-04-23 DIAGNOSIS — Z23 Encounter for immunization: Secondary | ICD-10-CM

## 2022-04-23 DIAGNOSIS — I10 Essential (primary) hypertension: Secondary | ICD-10-CM

## 2022-04-23 DIAGNOSIS — R609 Edema, unspecified: Secondary | ICD-10-CM

## 2022-04-23 DIAGNOSIS — E7800 Pure hypercholesterolemia, unspecified: Secondary | ICD-10-CM

## 2022-04-23 DIAGNOSIS — F17200 Nicotine dependence, unspecified, uncomplicated: Secondary | ICD-10-CM

## 2022-04-23 DIAGNOSIS — Z6833 Body mass index (BMI) 33.0-33.9, adult: Secondary | ICD-10-CM

## 2022-04-23 DIAGNOSIS — R600 Localized edema: Secondary | ICD-10-CM

## 2022-04-23 MED ORDER — FUROSEMIDE 40 MG PO TABS: 40.0000 mg | ORAL_TABLET | Freq: Every day | ORAL | 1 refills | Status: DC

## 2022-04-23 MED ORDER — OLMESARTAN MEDOXOMIL 20 MG PO TABS: 20.0000 mg | ORAL_TABLET | Freq: Every day | ORAL | 1 refills | Status: DC

## 2022-04-23 MED ORDER — ESCITALOPRAM OXALATE 20 MG PO TABS: 20.0000 mg | ORAL_TABLET | Freq: Every day | ORAL | 1 refills | Status: DC

## 2022-04-23 MED ORDER — ATORVASTATIN CALCIUM 40 MG PO TABS: 40.0000 mg | ORAL_TABLET | Freq: Every day | ORAL | 1 refills | Status: DC

## 2022-04-23 NOTE — Patient Instructions (Signed)
Peripheral Edema  Peripheral edema is swelling that is caused by a buildup of fluid. Peripheral edema most often affects the lower legs, ankles, and feet. It can also develop in the arms, hands, and face. The area of the body that has peripheral edema will look swollen. It may also feel heavy or warm. Your clothes may start to feel tight. Pressing on the area may make a temporary dent in your skin (pitting edema). You may not be able to move your swollen arm or leg as much as usual. There are many causes of peripheral edema. It can happen because of a complication of other conditions such as heart failure, kidney disease, or a problem with your circulation. It also can be a side effect of certain medicines or happen because of an infection. It often happens to women during pregnancy. Sometimes, the cause is not known. Follow these instructions at home: Managing pain, stiffness, and swelling  Raise (elevate) your legs while you are sitting or lying down. Move around often to prevent stiffness and to reduce swelling. Do not sit or stand for long periods of time. Do not wear tight clothing. Do not wear garters on your upper legs. Exercise your legs to get your circulation going. This helps to move the fluid back into your blood vessels, and it may help the swelling go down. Wear compression stockings as told by your health care provider. These stockings help to prevent blood clots and reduce swelling in your legs. It is important that these are the correct size. These stockings should be prescribed by your doctor to prevent possible injuries. If elastic bandages or wraps are recommended, use them as told by your health care provider. Medicines Take over-the-counter and prescription medicines only as told by your health care provider. Your health care provider may prescribe medicine to help your body get rid of excess water (diuretic). Take this medicine if you are told to take it. General  instructions Eat a low-salt (low-sodium) diet as told by your health care provider. Sometimes, eating less salt may reduce swelling. Pay attention to any changes in your symptoms. Moisturize your skin daily to help prevent skin from cracking and draining. Keep all follow-up visits. This is important. Contact a health care provider if: You have a fever. You have swelling in only one leg. You have increased swelling, redness, or pain in one or both of your legs. You have drainage or sores at the area where you have edema. Get help right away if: You have edema that starts suddenly or is getting worse, especially if you are pregnant or have a medical condition. You develop shortness of breath, especially when you are lying down. You have pain in your chest or abdomen. You feel weak. You feel like you will faint. These symptoms may be an emergency. Get help right away. Call 911. Do not wait to see if the symptoms will go away. Do not drive yourself to the hospital. Summary Peripheral edema is swelling that is caused by a buildup of fluid. Peripheral edema most often affects the lower legs, ankles, and feet. Move around often to prevent stiffness and to reduce swelling. Do not sit or stand for long periods of time. Pay attention to any changes in your symptoms. Contact a health care provider if you have edema that starts suddenly or is getting worse, especially if you are pregnant or have a medical condition. Get help right away if you develop shortness of breath, especially when lying down.   This information is not intended to replace advice given to you by your health care provider. Make sure you discuss any questions you have with your health care provider. Document Revised: 02/26/2021 Document Reviewed: 02/26/2021 Elsevier Patient Education  2023 Elsevier Inc.  

## 2022-04-23 NOTE — Progress Notes (Signed)
Subjective:    Patient ID: Donald Reeves, male    DOB: [birth year], 56 y.o.   MRN: [id number]   Chief Complaint: Medical Management of Chronic Issues    HPI:  Donald Reeves is a 56 y.o. who identifies as a male who was assigned male at birth.   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 Primary hypertension No c/o chest pain, sob or headache. Does not check blood pressure at home. BP Readings from Last 3 Encounters:  04/23/22 99/63  10/16/21 106/68  08/21/21 134/75     2. Pure hypercholesterolemia Does try to watch diet but does no dedicated exercise. Lab Results  Component Value Date   CHOL 130 10/16/2021   HDL 33 (L) 10/16/2021   LDLCALC 66 10/16/2021   TRIG 181 (H) 10/16/2021   CHOLHDL 3.9 10/16/2021   The 10-year ASCVD risk score (Arnett DK, et al., 2019) is: 6.9%   3. Panic attacks Is on lexapro and is workig well.    04/23/2022   10:12 AM 10/16/2021   10:18 AM 08/21/2021   12:12 PM  Depression screen PHQ 2/9  Decreased Interest 0 0 0  Down, Depressed, Hopeless 0 0 0  PHQ - 2 Score 0 0 0  Altered sleeping 0 0 0  Tired, decreased energy 0 0 0  Change in appetite 0 0 0  Feeling bad or failure about yourself  0 0 0  Trouble concentrating 0 0 0  Moving slowly or fidgety/restless 0 0 0  Suicidal thoughts 0 0 0  PHQ-9 Score 0 0 0  Difficult doing work/chores Not difficult at all Not difficult at all Not difficult at all     4. GAD (generalized anxiety disorder) Lexapro helps    04/23/2022   10:12 AM 10/16/2021   10:18 AM 08/21/2021   12:13 PM 04/10/2021    8:40 AM  GAD 7 : Generalized Anxiety Score  Nervous, Anxious, on Edge 1 0 0 0  Control/stop worrying 0 0 0 0  Worry too much - different things 0 0 0 0  Trouble relaxing 0 0 0 0  Restless 0 0 0 0  Easily annoyed or irritable 0 0 0 0  Afraid - awful might happen 0 0 0 0   Total GAD 7 Score 1 0 0 0  Anxiety Difficulty Not difficult at all Not difficult at all Not difficult at all Not difficult at all      5. Smoker Smokes over a pack a day. Refuses low dose CT scan  6. BMI 33.0-33.9,adult Weight is down 4 lbs Wt Readings from Last 3 Encounters:  04/23/22 255 lb (115.7 kg)  10/16/21 259 lb (117.5 kg)  08/21/21 253 lb (114.8 kg)   BMI Readings from Last 3 Encounters:  04/23/22 33.64 kg/m  10/16/21 34.17 kg/m  08/21/21 33.38 kg/m      New complaints: None today  No Known Allergies Outpatient Encounter Medications as of 04/23/2022  Medication Sig   albuterol (VENTOLIN HFA) 108 (90 Base) MCG/ACT inhaler Inhale 2 puffs into the lungs every 6 (six) hours as needed for wheezing or shortness of breath.   atorvastatin (LIPITOR) 40 MG tablet TAKE 1 TABLET BY MOUTH EVERY DAY   escitalopram (LEXAPRO) 20 MG tablet Take 1 tablet (20 mg total) by mouth daily.   furosemide (LASIX) 40 MG tablet Take 1 tablet (40  mg total) by mouth daily.   olmesartan (BENICAR) 20 MG tablet TAKE 1 TABLET BY MOUTH EVERY DAY   [DISCONTINUED] amoxicillin-clavulanate (AUGMENTIN) 875-125 MG tablet Take 1 tablet by mouth 2 (two) times daily.   [DISCONTINUED] predniSONE (DELTASONE) 20 MG tablet 2 po at same time daily for 5 days   No facility-administered encounter medications on file as of 04/23/2022.    Past Surgical History:  Procedure Laterality Date   EYE SURGERY     HERNIA REPAIR      Family History  Adopted: Yes  Problem Relation Age of Onset   Cancer Father       Controlled substance contract: n/a     Review of Systems  Constitutional:  Negative for diaphoresis.  Eyes:  Negative for pain.  Respiratory:  Negative for shortness of breath.   Cardiovascular:  Negative for chest pain, palpitations and leg swelling.  Gastrointestinal:  Negative for abdominal pain.  Endocrine: Negative for polydipsia.  Skin:  Negative for rash.  Neurological:  Negative  for dizziness, weakness and headaches.  Hematological:  Does not bruise/bleed easily.  All other systems reviewed and are negative.      Objective:   Physical Exam Vitals and nursing note reviewed.  Constitutional:      Appearance: Normal appearance. He is well-developed.  HENT:     Head: Normocephalic.     Nose: Nose normal.     Mouth/Throat:     Mouth: Mucous membranes are moist.     Pharynx: Oropharynx is clear.  Eyes:     Pupils: Pupils are equal, round, and reactive to light.  Neck:     Thyroid: No thyroid mass or thyromegaly.     Vascular: No carotid bruit or JVD.     Trachea: Phonation normal.  Cardiovascular:     Rate and Rhythm: Normal rate and regular rhythm.  Pulmonary:     Effort: Pulmonary effort is normal. No respiratory distress.     Breath sounds: Normal breath sounds.  Abdominal:     General: Bowel sounds are normal.     Palpations: Abdomen is soft.     Tenderness: There is no abdominal tenderness.  Musculoskeletal:        General: Normal range of motion.     Cervical back: Normal range of motion and neck supple.  Lymphadenopathy:     Cervical: No cervical adenopathy.  Skin:    General: Skin is warm and dry.  Neurological:     Mental Status: He is alert and oriented to person, place, and time.  Psychiatric:        Behavior: Behavior normal.        Thought Content: Thought content normal.        Judgment: Judgment normal.    BP 99/63   Pulse 67   Temp 98.4 F (36.9 C) (Temporal)   Resp 20   Ht _0  (1.854 m)   Wt 255 lb (115.7 kg)   SpO2 95%   BMI 33.64 kg/m         Assessment & Plan:   Reynolds Kittel comes in today with chief complaint of Medical Management of Chronic Issues   Diagnosis and orders addressed:  1. Primary hypertension Low sodium diet - olmesartan (BENICAR) 20 MG tablet; Take 1 tablet (20 mg total) by mouth daily.  Dispense: 90 tablet; Refill: 1 - CBC with Differential/Platelet - CMP14+EGFR  2. Pure  hypercholesterolemia Low fat diet - atorvastatin (LIPITOR) 40 MG tablet; Take 1 tablet (40  mg total) by mouth daily.  Dispense: 90 tablet; Refill: 1 - Lipid panel  3. Panic attacks Stress management - escitalopram (LEXAPRO) 20 MG tablet; Take 1 tablet (20 mg total) by mouth daily.  Dispense: 90 tablet; Refill: 1  4. GAD (generalized anxiety disorder) Stress management  5. Smoker Smoking cessation encouraged Refuses low dose CT scan  6. BMI 33.0-33.9,adult Discussed diet and exercise for person with BMI >25 Will recheck weight in 3-6 months   7. Peripheral edema Elevate legs when sitting - furosemide (LASIX) 40 MG tablet; Take 1 tablet (40 mg total) by mouth daily.  Dispense: 90 tablet; Refill: 1   Labs pending Health Maintenance reviewed Diet and exercise encouraged  Follow up plan: 6 months   Mary-Margaret Hassell Done, FNP

## 2022-04-24 LAB — CMP14+EGFR
ALT: 21 IU/L (ref 0–44)
AST: 17 IU/L (ref 0–40)
Albumin/Globulin Ratio: 1.9 (ref 1.2–2.2)
Albumin: 4.6 g/dL (ref 3.8–4.9)
Alkaline Phosphatase: 106 IU/L (ref 44–121)
BUN/Creatinine Ratio: 9 (ref 9–20)
BUN: 8 mg/dL (ref 6–24)
Bilirubin Total: 0.6 mg/dL (ref 0.0–1.2)
CO2: 27 mmol/L (ref 20–29)
Calcium: 9.6 mg/dL (ref 8.7–10.2)
Chloride: 94 mmol/L — ABNORMAL LOW (ref 96–106)
Creatinine, Ser: 0.91 mg/dL (ref 0.76–1.27)
Globulin, Total: 2.4 g/dL (ref 1.5–4.5)
Glucose: 104 mg/dL — ABNORMAL HIGH (ref 70–99)
Potassium: 4 mmol/L (ref 3.5–5.2)
Sodium: 139 mmol/L (ref 134–144)
Total Protein: 7 g/dL (ref 6.0–8.5)
eGFR: 100 mL/min/{1.73_m2} (ref >59)

## 2022-04-24 LAB — CBC WITH DIFFERENTIAL/PLATELET
Basophils Absolute: 0.1 10*3/uL (ref 0.0–0.2)
Basos: 1 %
EOS (ABSOLUTE): 0.2 10*3/uL (ref 0.0–0.4)
Eos: 3 %
Hematocrit: 42.8 % (ref 37.5–51.0)
Hemoglobin: 15.1 g/dL (ref 13.0–17.7)
Immature Grans (Abs): 0 10*3/uL (ref 0.0–0.1)
Immature Granulocytes: 0 %
Lymphocytes Absolute: 1.8 10*3/uL (ref 0.7–3.1)
Lymphs: 23 %
MCH: 31.6 pg (ref 26.6–33.0)
MCHC: 35.3 g/dL (ref 31.5–35.7)
MCV: 90 fL (ref 79–97)
Monocytes Absolute: 0.6 10*3/uL (ref 0.1–0.9)
Monocytes: 7 %
Neutrophils Absolute: 5.1 10*3/uL (ref 1.4–7.0)
Neutrophils: 66 %
Platelets: 212 10*3/uL (ref 150–450)
RBC: 4.78 x10E6/uL (ref 4.14–5.80)
RDW: 12.4 % (ref 11.6–15.4)
WBC: 7.8 10*3/uL (ref 3.4–10.8)

## 2022-04-24 LAB — LIPID PANEL
Chol/HDL Ratio: 3.5 ratio (ref 0.0–5.0)
Cholesterol, Total: 129 mg/dL (ref 100–199)
HDL: 37 mg/dL — ABNORMAL LOW (ref >39)
LDL Chol Calc (NIH): 73 mg/dL (ref 0–99)
Triglycerides: 102 mg/dL (ref 0–149)
VLDL Cholesterol Cal: 19 mg/dL (ref 5–40)

## 2022-05-20 ENCOUNTER — Other Ambulatory Visit: Payer: Self-pay | Admitting: Nurse Practitioner

## 2022-05-20 DIAGNOSIS — J209 Acute bronchitis, unspecified: Secondary | ICD-10-CM

## 2022-06-18 ENCOUNTER — Other Ambulatory Visit: Payer: Self-pay | Admitting: Nurse Practitioner

## 2022-06-18 DIAGNOSIS — J209 Acute bronchitis, unspecified: Secondary | ICD-10-CM

## 2022-07-15 NOTE — Progress Notes (Signed)
[facility] referral received. Attempted to reach the patient, but was unable to reach the patient directly. Detailed VM left asking that the patient return my call. 

## 2022-08-28 NOTE — Progress Notes (Signed)
[facility] referral received. Attempted to reach the patient, but was unable to reach the patient directly. VM box unavailable.

## 2022-09-30 ENCOUNTER — Ambulatory Visit: Payer: 59 | Admitting: Family Medicine

## 2022-09-30 ENCOUNTER — Encounter: Payer: Self-pay | Admitting: Family Medicine

## 2022-09-30 VITALS — BP 137/83 | HR 68 | Temp 97.7°F | Ht 73.0 in | Wt 262.0 lb

## 2022-09-30 DIAGNOSIS — B9689 Other specified bacterial agents as the cause of diseases classified elsewhere: Secondary | ICD-10-CM | POA: Diagnosis not present

## 2022-09-30 DIAGNOSIS — F17210 Nicotine dependence, cigarettes, uncomplicated: Secondary | ICD-10-CM

## 2022-09-30 DIAGNOSIS — J208 Acute bronchitis due to other specified organisms: Secondary | ICD-10-CM

## 2022-09-30 MED ORDER — BENZONATATE 200 MG PO CAPS: 200.0000 mg | ORAL_CAPSULE | Freq: Two times a day (BID) | ORAL | 0 refills | Status: DC | PRN

## 2022-09-30 MED ORDER — PREDNISONE 20 MG PO TABS: ORAL_TABLET | ORAL | 0 refills | Status: DC

## 2022-09-30 MED ORDER — DOXYCYCLINE HYCLATE 100 MG PO TABS: 100.0000 mg | ORAL_TABLET | Freq: Two times a day (BID) | ORAL | 0 refills | Status: AC

## 2022-09-30 NOTE — Patient Instructions (Signed)
Lung Cancer Screening A lung cancer screening is a test that checks for lung cancer when there are no symptoms or history of that disease. The screening is done to look for lung cancer in its very early stages. Finding cancer early improves the chances of successful treatment. It may save your life. Who should have a screening? You should be screened for lung cancer if all of these apply: You currently smoke, or you have quit smoking within the past 15 years. You are between the ages of 50 and 80 years old. Screening may be recommended up to age 80 depending on your overall health and other factors. You have a smoking history of 1 pack of cigarettes a day for 20 years or 2 packs a day for 10 years. How is screening done?  The recommended screening test is a low-dose computed tomography (LDCT) scan. This scan takes detailed images of the lungs. This allows a health care provider to look for abnormal cells. If you are at risk for lung cancer, it is recommended that you get screened once a year. Talk to your health care provider about the risks, benefits, and limitations of screening. What are the benefits of screening? Screening can find lung cancer early, before symptoms start and before it has spread outside of the lungs. The chances of curing lung cancer are greater if the cancer is diagnosed early. What are the risks of screening? The screening may show lung cancer when no cancer is present. Talk with your health care provider about what your results mean. In some cases, your health care provider may do more testing to confirm the results. The screening may not find lung cancer when it is present. You will be exposed to radiation from repeated LDCT tests, which can cause cancer in otherwise healthy people. How can I lower my risk of lung cancer? Make these lifestyle changes to lower your risk of developing lung cancer: Do not use any products that contain nicotine or tobacco. These products  include cigarettes, chewing tobacco, and vaping devices, such as e-cigarettes. If you need help quitting, ask your health care provider. Avoid secondhand smoke. Avoid exposure to radiation. Avoid exposure to radon gas. Have your home checked for radon regularly. Avoid things that cause cancer (carcinogens). Avoid living or working in places with high air pollution or diesel exhaust. Questions to ask your health care provider Am I eligible for lung cancer screening? Does my health insurance cover the cost of lung cancer screening? What happens if the lung cancer screening shows something of concern? How soon will I have results from my lung cancer screening? Is there anything that I need to do to prepare for my lung cancer screening? What happens if I decide not to have lung cancer screening? Where to find more information Ask your health care provider about the risks and benefits of screening. More information and resources are available from these organizations: American Cancer Society (ACS): www.cancer.org American Lung Association: www.lung.org National Cancer Institute: www.cancer.gov Contact a health care provider if: You start to show symptoms of lung cancer, including: A cough that will not go away. High-pitched whistling sounds when you breathe, most often when you breathe out (wheezing). Chest pain. Coughing up blood. Shortness of breath. Weight loss that cannot be explained. Constant tiredness (fatigue). Hoarse voice. Summary Lung cancer screening may find lung cancer before symptoms appear. Finding cancer early improves the chances of successful treatment. It may save your life. The recommended screening test is a low-dose   computed tomography (LDCT) scan that looks for abnormal cells in the lungs. If you are at risk for lung cancer, it is recommended that you get screened once a year. You can make lifestyle changes to lower your risk of lung cancer. Ask your health care  provider about the risks and benefits of screening. This information is not intended to replace advice given to you by your health care provider. Make sure you discuss any questions you have with your health care provider. Document Revised: 12/13/2020 Document Reviewed: 12/13/2020 Elsevier Patient Education  2023 Elsevier Inc.   

## 2022-09-30 NOTE — Progress Notes (Signed)
Subjective: [id number] PCP: Chevis Pretty, FNP [id number] Donald Reeves is a 57 y.o. male presenting to clinic today for:  1.  URI Patient reports onset on Thursday.  He reports work adductive cough with yellow sputum.  He has had some shortness of breath and wheezing.  He feels full of mucus in his chest.  Potentially has some rhinorrhea but no observable postnasal drip despite cough being worse in the nighttime.  Though he does report he coughs all day.  Utilize some Benadryl at the beginning because he thought it might be allergic in nature.  Denies any hemoptysis, fevers, chills, known sick contacts.  No medications tried otherwise.   Smokes 1 pack/day and has done so for several years.  Working on smoking cessation.  Recently ordered patches   ROS: Per HPI  No Known Allergies Past Medical History:  Diagnosis Date   Anxiety     Current Outpatient Medications:    albuterol (VENTOLIN HFA) 108 (90 Base) MCG/ACT inhaler, INHALE 2 PUFFS INTO THE LUNGS EVERY 6 HOURS AS NEEDED FOR WHEEZE OR SHORTNESS OF BREATH, Disp: 6.7 each, Rfl: 2   atorvastatin (LIPITOR) 40 MG tablet, Take 1 tablet (40 mg total) by mouth daily., Disp: 90 tablet, Rfl: 1   escitalopram (LEXAPRO) 20 MG tablet, Take 1 tablet (20 mg total) by mouth daily., Disp: 90 tablet, Rfl: 1   furosemide (LASIX) 40 MG tablet, Take 1 tablet (40 mg total) by mouth daily., Disp: 90 tablet, Rfl: 1   olmesartan (BENICAR) 20 MG tablet, Take 1 tablet (20 mg total) by mouth daily., Disp: 90 tablet, Rfl: 1 Social History   Socioeconomic History   Marital status: Married    Spouse name: Not on file   Number of children: Not on file   Years of education: Not on file   Highest education level: Not on file  Occupational History   Not on file  Tobacco Use   Smoking status: Every Day    Packs/day: 1.00    Years: 30.00    Additional pack years: 0.00    Total pack years: 30.00    Types: Cigarettes   Smokeless tobacco: Never   Tobacco  comments:    ready to quit  Substance and Sexual Activity   Alcohol use: No   Drug use: No   Sexual activity: Not on file  Other Topics Concern   Not on file  Social History Narrative   Not on file   Social Determinants of Health   Financial Resource Strain: Not on file  Food Insecurity: Not on file  Transportation Needs: Not on file  Physical Activity: Not on file  Stress: Not on file  Social Connections: Not on file  Intimate Partner Violence: Not on file   Family History  Adopted: Yes  Problem Relation Age of Onset   Cancer Father     Objective: Office vital signs reviewed. BP 137/83   Pulse 68   Temp 97.7 F (36.5 C)   Ht 6\' 1"  (1.854 m)   Wt 262 lb (118.8 kg)   SpO2 98%   BMI 34.57 kg/m   Physical Examination:  General: Awake, alert, well nourished, No acute distress HEENT: Normal    Neck: No masses palpated. No lymphadenopathy    Ears: Tympanic membranes intact, normal light reflex, no erythema, no bulging    Eyes: PERRLA, extraocular membranes intact, sclera white    Nose: nasal turbinates moist, clear nasal discharge    Throat: moist mucus membranes, no erythema, no  tonsillar exudate.  Airway is patent Cardio: regular rate and rhythm, S1S2 heard, no murmurs appreciated Pulm: Global inspiratory and expiratory wheezes.  No rhonchi or rales; normal work of breathing on room air.  Coughing intermittently    Assessment/ Plan: 57 y.o. male   Acute bacterial bronchitis - Plan: predniSONE (DELTASONE) 20 MG tablet, doxycycline (VIBRA-TABS) 100 MG tablet, benzonatate (TESSALON) 200 MG capsule  Heavy tobacco smoker >10 cigarettes per day - Plan: predniSONE (DELTASONE) 20 MG tablet, doxycycline (VIBRA-TABS) 100 MG tablet, benzonatate (TESSALON) 200 MG capsule  Going to treat him as an acute bacterial bronchitis for suspected COPD.  He has expiratory wheezes throughout on exam.  Steroid burst, doxycycline and Tessalon Perles prescribed.  Home care instructions  reviewed.  Smoking cessation encouraged.  Encouraged him to proceed with lung cancer screening as well, which was ordered by his PCP last year.  He will follow-up with PCP as directed  No orders of the defined types were placed in this encounter.  No orders of the defined types were placed in this encounter.    Janora Norlander, DO [place] [phone]

## 2022-10-16 ENCOUNTER — Other Ambulatory Visit: Payer: Self-pay | Admitting: Nurse Practitioner

## 2022-10-16 DIAGNOSIS — R600 Localized edema: Secondary | ICD-10-CM

## 2022-10-23 ENCOUNTER — Other Ambulatory Visit: Payer: Self-pay | Admitting: Nurse Practitioner

## 2022-10-23 ENCOUNTER — Other Ambulatory Visit: Payer: Self-pay

## 2022-10-23 DIAGNOSIS — E7800 Pure hypercholesterolemia, unspecified: Secondary | ICD-10-CM

## 2022-10-23 DIAGNOSIS — Z122 Encounter for screening for malignant neoplasm of respiratory organs: Secondary | ICD-10-CM

## 2022-10-23 DIAGNOSIS — Z87891 Personal history of nicotine dependence: Secondary | ICD-10-CM

## 2022-10-23 DIAGNOSIS — I10 Essential (primary) hypertension: Secondary | ICD-10-CM

## 2022-10-23 NOTE — Progress Notes (Signed)
LDCT order per protocol

## 2022-10-23 NOTE — Progress Notes (Signed)
Received referral for initial lung cancer screening scan. Contacted patient and obtained smoking history (started age 57, current smoker continuing to smoke 1PPD, 37 pack year) as well as answering questions related to the screening process. Patient denies signs/symptoms of lung cancer such as weight loss or hemoptysis. Patient denies comorbidity that would prevent curative treatment if lung cancer were to be found. Patient is scheduled for shared decision making visit and CT scan on 11/11/22 at 0815.

## 2022-10-24 ENCOUNTER — Ambulatory Visit: Payer: 59 | Admitting: Nurse Practitioner

## 2022-10-24 ENCOUNTER — Encounter: Payer: Self-pay | Admitting: Nurse Practitioner

## 2022-10-24 VITALS — BP 111/70 | HR 79 | Temp 97.5°F | Resp 20 | Ht 73.0 in | Wt 252.0 lb

## 2022-10-24 DIAGNOSIS — I10 Essential (primary) hypertension: Secondary | ICD-10-CM | POA: Diagnosis not present

## 2022-10-24 DIAGNOSIS — Z6833 Body mass index (BMI) 33.0-33.9, adult: Secondary | ICD-10-CM

## 2022-10-24 DIAGNOSIS — E7800 Pure hypercholesterolemia, unspecified: Secondary | ICD-10-CM | POA: Diagnosis not present

## 2022-10-24 DIAGNOSIS — F410 Panic disorder [episodic paroxysmal anxiety] without agoraphobia: Secondary | ICD-10-CM

## 2022-10-24 DIAGNOSIS — F17200 Nicotine dependence, unspecified, uncomplicated: Secondary | ICD-10-CM

## 2022-10-24 DIAGNOSIS — Z125 Encounter for screening for malignant neoplasm of prostate: Secondary | ICD-10-CM

## 2022-10-24 DIAGNOSIS — R600 Localized edema: Secondary | ICD-10-CM

## 2022-10-24 DIAGNOSIS — F411 Generalized anxiety disorder: Secondary | ICD-10-CM

## 2022-10-24 MED ORDER — ESCITALOPRAM OXALATE 20 MG PO TABS: 20.0000 mg | ORAL_TABLET | Freq: Every day | ORAL | 1 refills | Status: DC

## 2022-10-24 MED ORDER — OLMESARTAN MEDOXOMIL 20 MG PO TABS: 20.0000 mg | ORAL_TABLET | Freq: Every day | ORAL | 1 refills | Status: DC

## 2022-10-24 MED ORDER — FUROSEMIDE 40 MG PO TABS
40.0000 mg | ORAL_TABLET | Freq: Every day | ORAL | 1 refills | Status: DC
Start: 2022-10-24 — End: 2023-05-01

## 2022-10-24 MED ORDER — ATORVASTATIN CALCIUM 40 MG PO TABS: 40.0000 mg | ORAL_TABLET | Freq: Every day | ORAL | 1 refills | Status: DC

## 2022-10-24 NOTE — Progress Notes (Signed)
Subjective:    Patient ID: Donald Reeves, male    DOB: September 22, 1965, 57 y.o.   MRN: [id number]   Chief Complaint: Medical Management of Chronic Issues    HPI:  Donald Reeves is a 57 y.o. who identifies as a male who was assigned male at birth.   Social history: Lives with: wife and daughters   Comes in today for follow up of the following chronic medical issues:  1. Primary hypertension No c/o chest pain, sob or headache. Does not check blood pressure at home BP Readings from Last 3 Encounters:  10/24/22 111/70  09/30/22 137/83  04/23/22 99/63     2. Pure hypercholesterolemia Does try to watch diet but does no dedicated exercise. Lab Results  Component Value Date   CHOL 129 04/23/2022   HDL 37 (L) 04/23/2022   LDLCALC 73 04/23/2022   TRIG 102 04/23/2022   CHOLHDL 3.5 04/23/2022     3. BMI 33.0-33.9,adult No recent weight changes BMI Readings from Last 3 Encounters:  10/24/22 33.25 kg/m  09/30/22 34.57 kg/m  04/23/22 33.64 kg/m   Wt Readings from Last 3 Encounters:  10/24/22 252 lb (114.3 kg)  09/30/22 262 lb (118.8 kg)  04/23/22 255 lb (115.7 kg)     4. GAD (generalized anxiety disorder) 5. Panic attacks Is on lexapro and is doing well    10/24/2022    9:38 AM 09/30/2022   10:12 AM 04/23/2022   10:12 AM 10/16/2021   10:18 AM  GAD 7 : Generalized Anxiety Score  Nervous, Anxious, on Edge 1 0 1 0  Control/stop worrying 1 0 0 0  Worry too much - different things 1 0 0 0  Trouble relaxing 0 0 0 0  Restless 0 0 0 0  Easily annoyed or irritable 0 0 0 0  Afraid - awful might happen 0 0 0 0  Total GAD 7 Score 3 0 1 0  Anxiety Difficulty Not difficult at all Not difficult at all Not difficult at all Not difficult at all       6. Smoker Smokes at least 1/ pack a day.   New complaints: None today  No Known Allergies Outpatient Encounter Medications as of 10/24/2022  Medication Sig   albuterol (VENTOLIN HFA) 108 (90 Base) MCG/ACT inhaler  INHALE 2 PUFFS INTO THE LUNGS EVERY 6 HOURS AS NEEDED FOR WHEEZE OR SHORTNESS OF BREATH   atorvastatin (LIPITOR) 40 MG tablet TAKE 1 TABLET BY MOUTH EVERY DAY   escitalopram (LEXAPRO) 20 MG tablet Take 1 tablet (20 mg total) by mouth daily.   furosemide (LASIX) 40 MG tablet TAKE 1 TABLET BY MOUTH EVERY DAY   olmesartan (BENICAR) 20 MG tablet TAKE 1 TABLET BY MOUTH EVERY DAY   [DISCONTINUED] benzonatate (TESSALON) 200 MG capsule Take 1 capsule (200 mg total) by mouth 2 (two) times daily as needed for cough.   [DISCONTINUED] predniSONE (DELTASONE) 20 MG tablet 2 po at same time daily for 5 days   No facility-administered encounter medications on file as of 10/24/2022.    Past Surgical History:  Procedure Laterality Date   EYE SURGERY     HERNIA REPAIR      Family History  Adopted: Yes  Problem Relation Age of Onset   Cancer Father       Controlled substance contract: n/a      Review of Systems  Constitutional:  Negative for diaphoresis.  Eyes:  Negative for pain.  Respiratory:  Negative for shortness of breath.  Cardiovascular:  Negative for chest pain, palpitations and leg swelling.  Gastrointestinal:  Negative for abdominal pain.  Endocrine: Negative for polydipsia.  Skin:  Negative for rash.  Neurological:  Negative for dizziness, weakness and headaches.  Hematological:  Does not bruise/bleed easily.  All other systems reviewed and are negative.      Objective:   Physical Exam Vitals and nursing note reviewed.  Constitutional:      Appearance: Normal appearance. He is well-developed.  HENT:     Head: Normocephalic.     Nose: Nose normal.     Mouth/Throat:     Mouth: Mucous membranes are moist.     Pharynx: Oropharynx is clear.  Eyes:     Pupils: Pupils are equal, round, and reactive to light.  Neck:     Thyroid: No thyroid mass or thyromegaly.     Vascular: No carotid bruit or JVD.     Trachea: Phonation normal.  Cardiovascular:     Rate and Rhythm:  Normal rate and regular rhythm.  Pulmonary:     Effort: Pulmonary effort is normal. No respiratory distress.     Breath sounds: Normal breath sounds.  Abdominal:     General: Bowel sounds are normal.     Palpations: Abdomen is soft.     Tenderness: There is no abdominal tenderness.  Musculoskeletal:        General: Normal range of motion.     Cervical back: Normal range of motion and neck supple.  Lymphadenopathy:     Cervical: No cervical adenopathy.  Skin:    General: Skin is warm and dry.  Neurological:     Mental Status: He is alert and oriented to person, place, and time.  Psychiatric:        Behavior: Behavior normal.        Thought Content: Thought content normal.        Judgment: Judgment normal.    BP 111/70   Pulse 79   Temp (!) 97.5 F (36.4 C) (Temporal)   Resp 20   Ht  (1.854 m)   Wt 252 lb (114.3 kg)   SpO2 100%   BMI 33.25 kg/m         Assessment & Plan:   Donald Reeves comes in today with chief complaint of Medical Management of Chronic Issues   Diagnosis and orders addressed:  1. Primary hypertension Low sodium diet - CBC with Differential/Platelet - CMP14+EGFR - olmesartan (BENICAR) 20 MG tablet; Take 1 tablet (20 mg total) by mouth daily.  Dispense: 90 tablet; Refill: 1  2. Pure hypercholesterolemia Low fta diet - Lipid panel - atorvastatin (LIPITOR) 40 MG tablet; Take 1 tablet (40 mg total) by mouth daily.  Dispense: 90 tablet; Refill: 1  3. BMI 33.0-33.9,adult Discussed diet and exercise for person with BMI >25 Will recheck weight in 3-6 months   4. GAD (generalized anxiety disorder) Stress management  5. Panic attacks - escitalopram (LEXAPRO) 20 MG tablet; Take 1 tablet (20 mg total) by mouth daily.  Dispense: 90 tablet; Refill: 1  6. Smoker Smoking cessation  7. Prostate cancer screening - PSA, total and free  8. Peripheral edema Elevate legs when sitting - furosemide (LASIX) 40 MG tablet; Take 1 tablet (40 mg  total) by mouth daily.  Dispense: 90 tablet; Refill: 1   Labs pending Health Maintenance reviewed Diet and exercise encouraged  Follow up plan: 6 months   Donald Daphine Deutscher, FNP

## 2022-10-24 NOTE — Patient Instructions (Signed)

## 2022-11-08 NOTE — Progress Notes (Signed)
Patient called to cancel Lung Cancer Screening appointments. Patient does not wish to reschedule at this time. Referral closed at patient request.

## 2022-11-11 ENCOUNTER — Ambulatory Visit (HOSPITAL_COMMUNITY): Payer: 59

## 2022-11-11 ENCOUNTER — Inpatient Hospital Stay: Payer: 59 | Admitting: Physician Assistant

## 2022-12-10 IMAGING — DX DG CHEST 2V
2 series · 2 of 2 positions shown · non-contrast
Comparison: Chest radiograph dated 01/16/2021.

CLINICAL DATA: Congestion and wheezing.

EXAM:
CHEST - 2 VIEW

[chest pa]
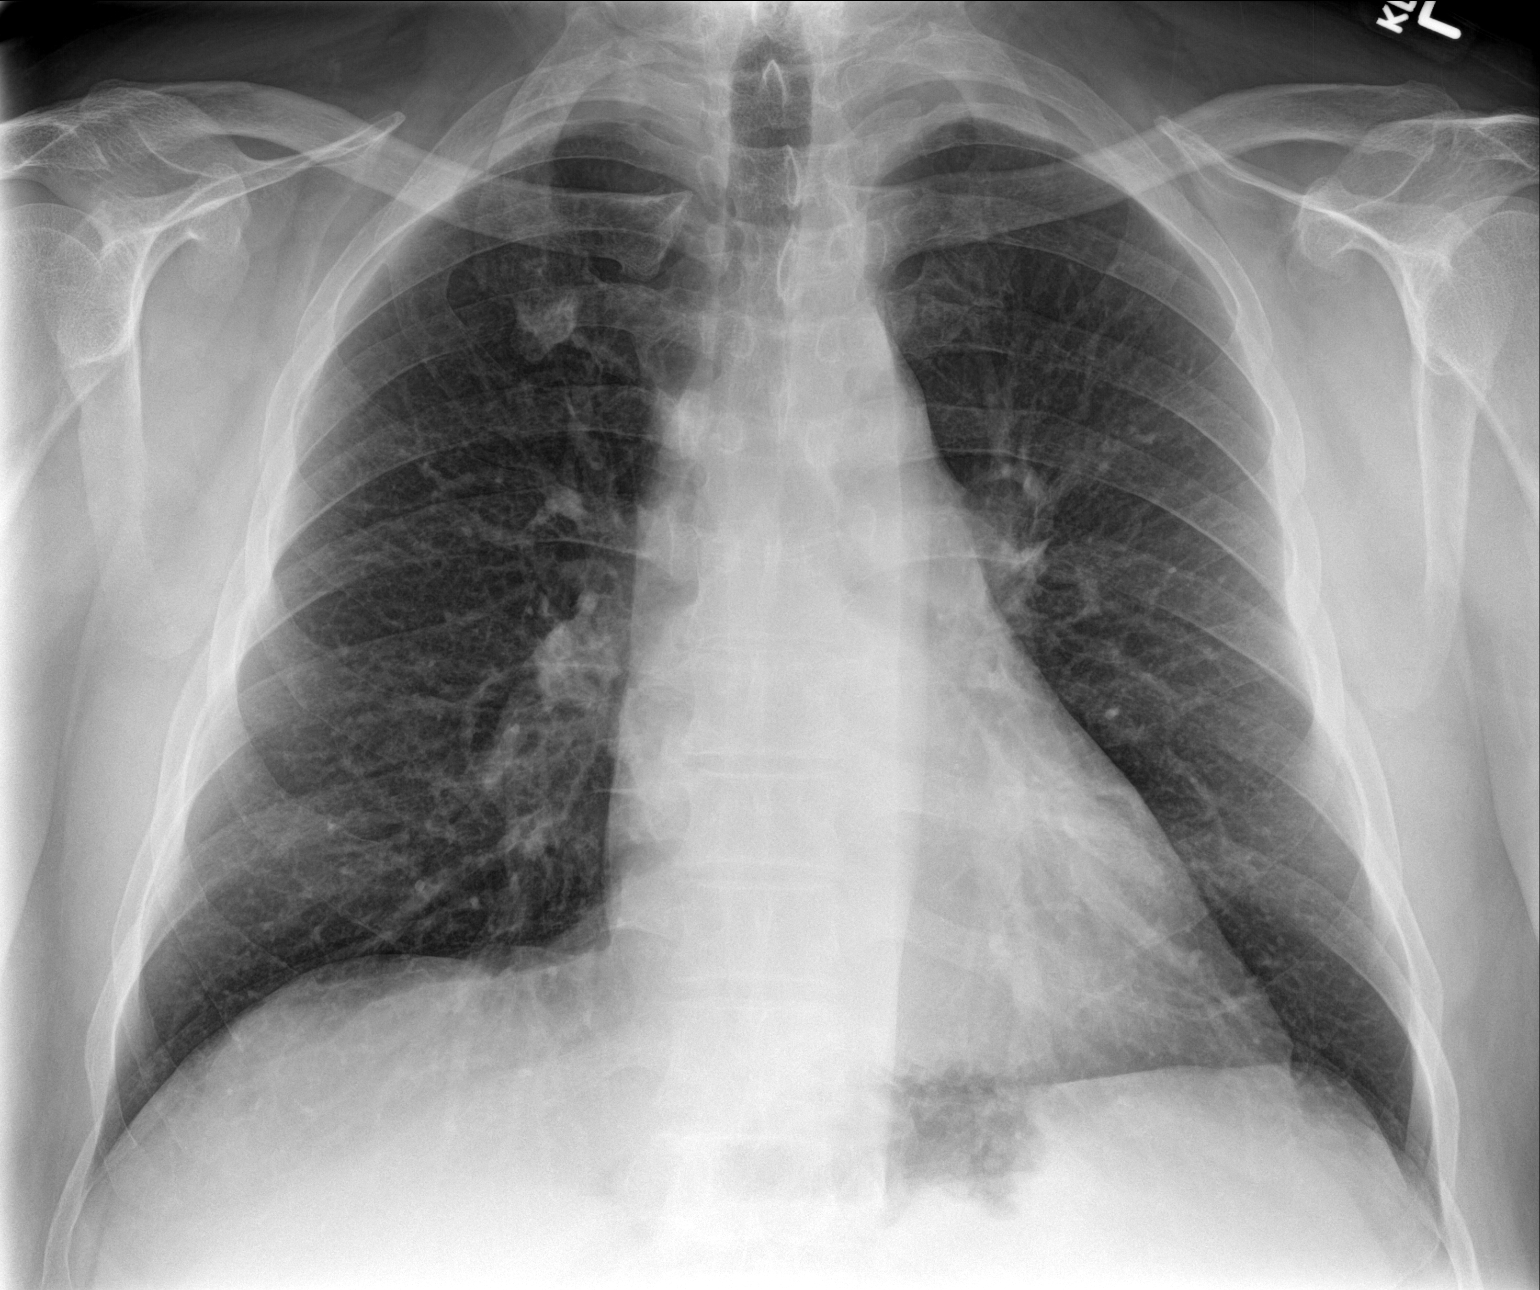

[chest lat]
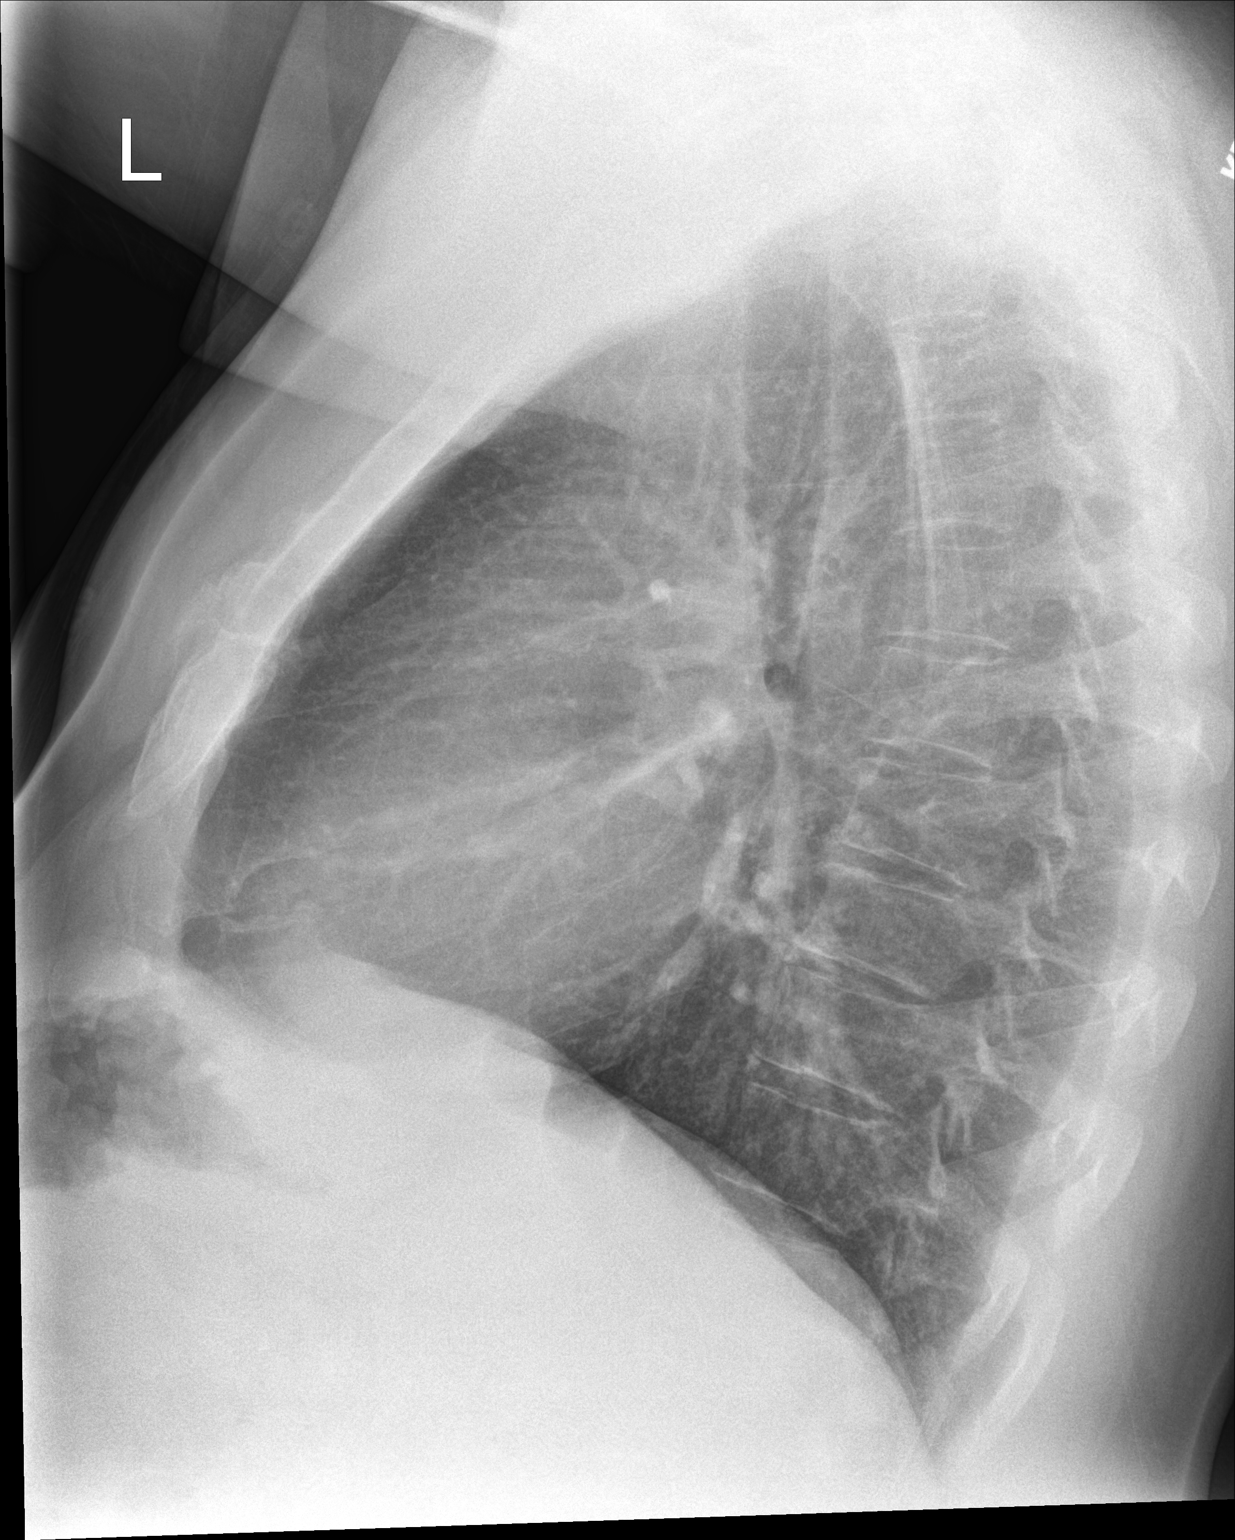

[2 of 2 positions shown; findings below may reference images not displayed]

FINDINGS: No focal consolidation, pleural effusion, pneumothorax the cardiac
silhouette is within normal limits. No acute osseous pathology with
IMPRESSION: No active cardiopulmonary disease.

## 2023-04-13 ENCOUNTER — Other Ambulatory Visit: Payer: Self-pay | Admitting: Nurse Practitioner

## 2023-04-13 DIAGNOSIS — F410 Panic disorder [episodic paroxysmal anxiety] without agoraphobia: Secondary | ICD-10-CM

## 2023-05-01 ENCOUNTER — Ambulatory Visit: Payer: 59 | Admitting: Nurse Practitioner

## 2023-05-01 ENCOUNTER — Encounter: Payer: Self-pay | Admitting: Nurse Practitioner

## 2023-05-01 VITALS — BP 118/70 | HR 65 | Temp 97.5°F | Resp 20 | Ht 73.0 in | Wt 255.5 lb

## 2023-05-01 DIAGNOSIS — I10 Essential (primary) hypertension: Secondary | ICD-10-CM | POA: Diagnosis not present

## 2023-05-01 DIAGNOSIS — F17200 Nicotine dependence, unspecified, uncomplicated: Secondary | ICD-10-CM

## 2023-05-01 DIAGNOSIS — F411 Generalized anxiety disorder: Secondary | ICD-10-CM | POA: Diagnosis not present

## 2023-05-01 DIAGNOSIS — Z6833 Body mass index (BMI) 33.0-33.9, adult: Secondary | ICD-10-CM

## 2023-05-01 DIAGNOSIS — F410 Panic disorder [episodic paroxysmal anxiety] without agoraphobia: Secondary | ICD-10-CM | POA: Diagnosis not present

## 2023-05-01 DIAGNOSIS — Z23 Encounter for immunization: Secondary | ICD-10-CM

## 2023-05-01 DIAGNOSIS — E7800 Pure hypercholesterolemia, unspecified: Secondary | ICD-10-CM | POA: Diagnosis not present

## 2023-05-01 DIAGNOSIS — R600 Localized edema: Secondary | ICD-10-CM

## 2023-05-01 DIAGNOSIS — Z125 Encounter for screening for malignant neoplasm of prostate: Secondary | ICD-10-CM

## 2023-05-01 MED ORDER — VARENICLINE TARTRATE (STARTER) 0.5 MG X 11 & 1 MG X 42 PO TBPK: ORAL_TABLET | ORAL | 0 refills | Status: DC

## 2023-05-01 MED ORDER — OLMESARTAN MEDOXOMIL 20 MG PO TABS: 20.0000 mg | ORAL_TABLET | Freq: Every day | ORAL | 1 refills | Status: DC

## 2023-05-01 MED ORDER — VARENICLINE TARTRATE 1 MG PO TABS: 1.0000 mg | ORAL_TABLET | Freq: Two times a day (BID) | ORAL | 3 refills | Status: DC

## 2023-05-01 MED ORDER — FUROSEMIDE 40 MG PO TABS: 40.0000 mg | ORAL_TABLET | Freq: Every day | ORAL | 1 refills | Status: DC

## 2023-05-01 MED ORDER — ATORVASTATIN CALCIUM 40 MG PO TABS: 40.0000 mg | ORAL_TABLET | Freq: Every day | ORAL | 1 refills | Status: DC

## 2023-05-01 MED ORDER — ESCITALOPRAM OXALATE 20 MG PO TABS
20.0000 mg | ORAL_TABLET | Freq: Every day | ORAL | 1 refills | Status: DC
Start: 2023-05-01 — End: 2023-10-30

## 2023-05-01 NOTE — Progress Notes (Signed)
Subjective:    Patient ID: Donald Reeves, male    DOB: 1965/10/16, 57 y.o.   MRN: [id number]   Chief Complaint: medical management of chronic issues     HPI:  Donald Reeves is a 57 y.o. who identifies as a male who was assigned male at birth.   Social history: Lives with: wife and daughters   Comes in today for follow up of the following chronic medical issues:  1. Primary hypertension No c/o chest pain, sob or headache. Does not check blood pressure at home. BP Readings from Last 3 Encounters:  10/24/22 111/70  09/30/22 137/83  04/23/22 99/63     2. Pure hypercholesterolemia Does try to watch diet and does stays active. Lab Results  Component Value Date   CHOL 129 04/23/2022   HDL 37 (L) 04/23/2022   LDLCALC 73 04/23/2022   TRIG 102 04/23/2022   CHOLHDL 3.5 04/23/2022     3. GAD (generalized anxiety disorder 4. Panic attacks Is on lexapro and is doing well.    05/01/2023    9:19 AM 10/24/2022    9:38 AM 09/30/2022   10:12 AM 04/23/2022   10:12 AM  GAD 7 : Generalized Anxiety Score  Nervous, Anxious, on Edge 1 1 0 1  Control/stop worrying 0 1 0 0  Worry too much - different things 1 1 0 0  Trouble relaxing 1 0 0 0  Restless 0 0 0 0  Easily annoyed or irritable 0 0 0 0  Afraid - awful might happen 0 0 0 0  Total GAD 7 Score 3 3 0 1  Anxiety Difficulty Not difficult at all Not difficult at all Not difficult at all Not difficult at all       05/01/2023    9:18 AM 10/24/2022    9:38 AM 09/30/2022   10:12 AM  Depression screen PHQ 2/9  Decreased Interest 1 0 0  Down, Depressed, Hopeless 0 0 0  PHQ - 2 Score 1 0 0  Altered sleeping 0 0 0  Tired, decreased energy 1 0 0  Change in appetite 0 0 0  Feeling bad or failure about yourself  0 0 0  Trouble concentrating 0 0 0  Moving slowly or fidgety/restless 0 0 0  Suicidal thoughts 0 0 0  PHQ-9 Score 2 0 0  Difficult doing work/chores Not difficult at all Not difficult at all Not difficult at all      5. Smoker Smokes over a pack a day  6. BMI 33.0-33.9,adult No recent weight changes Wt Readings from Last 3 Encounters:  05/01/23 255 lb 8 oz (115.9 kg)  10/24/22 252 lb (114.3 kg)  09/30/22 262 lb (118.8 kg)   BMI Readings from Last 3 Encounters:  05/01/23 33.71 kg/m  10/24/22 33.25 kg/m  09/30/22 34.57 kg/m      New complaints: None today  No Known Allergies Outpatient Encounter Medications as of 05/01/2023  Medication Sig   albuterol (VENTOLIN HFA) 108 (90 Base) MCG/ACT inhaler INHALE 2 PUFFS INTO THE LUNGS EVERY 6 HOURS AS NEEDED FOR WHEEZE OR SHORTNESS OF BREATH   atorvastatin (LIPITOR) 40 MG tablet Take 1 tablet (40 mg total) by mouth daily.   escitalopram (LEXAPRO) 20 MG tablet TAKE 1 TABLET BY MOUTH EVERY DAY   furosemide (LASIX) 40 MG tablet Take 1 tablet (40 mg total) by mouth daily.   olmesartan (BENICAR) 20 MG tablet Take 1 tablet (20 mg total) by mouth daily.   No facility-administered encounter  medications on file as of 05/01/2023.    Past Surgical History:  Procedure Laterality Date   EYE SURGERY     HERNIA REPAIR      Family History  Adopted: Yes  Problem Relation Age of Onset   Cancer Father       Controlled substance contract: n/a     Review of Systems  Constitutional:  Negative for diaphoresis.  Eyes:  Negative for pain.  Respiratory:  Negative for shortness of breath.   Cardiovascular:  Negative for chest pain, palpitations and leg swelling.  Gastrointestinal:  Negative for abdominal pain.  Endocrine: Negative for polydipsia.  Skin:  Negative for rash.  Neurological:  Negative for dizziness, weakness and headaches.  Hematological:  Does not bruise/bleed easily.  All other systems reviewed and are negative.      Objective:   Physical Exam Vitals and nursing note reviewed.  Constitutional:      Appearance: Normal appearance. He is well-developed.  HENT:     Head: Normocephalic.     Nose: Nose normal.      Mouth/Throat:     Mouth: Mucous membranes are moist.     Pharynx: Oropharynx is clear.  Eyes:     Pupils: Pupils are equal, round, and reactive to light.  Neck:     Thyroid: No thyroid mass or thyromegaly.     Vascular: No carotid bruit or JVD.     Trachea: Phonation normal.  Cardiovascular:     Rate and Rhythm: Normal rate and regular rhythm.  Pulmonary:     Effort: Pulmonary effort is normal. No respiratory distress.     Breath sounds: Normal breath sounds.  Abdominal:     General: Bowel sounds are normal.     Palpations: Abdomen is soft.     Tenderness: There is no abdominal tenderness.  Musculoskeletal:        General: Normal range of motion.     Cervical back: Normal range of motion and neck supple.  Lymphadenopathy:     Cervical: No cervical adenopathy.  Skin:    General: Skin is warm and dry.  Neurological:     Mental Status: He is alert and oriented to person, place, and time.  Psychiatric:        Behavior: Behavior normal.        Thought Content: Thought content normal.        Judgment: Judgment normal.    BP 118/70   Pulse 65   Temp (!) 97.5 F (36.4 C) (Oral)   Resp 20   Ht 6\' 1"  (1.854 m)   Wt 255 lb 8 oz (115.9 kg)   SpO2 96%   BMI 33.71 kg/m         Assessment & Plan:  Donald Reeves comes in today with chief complaint of Medical Management of Chronic Issues   Diagnosis and orders addressed:  1. Primary hypertension Low sodium diet - olmesartan (BENICAR) 20 MG tablet; Take 1 tablet (20 mg total) by mouth daily.  Dispense: 90 tablet; Refill: 1 - CBC with Differential/Platelet - CMP14+EGFR  2. Pure hypercholesterolemia Low fat diet - atorvastatin (LIPITOR) 40 MG tablet; Take 1 tablet (40 mg total) by mouth daily.  Dispense: 90 tablet; Refill: 1 - Lipid panel  3. GAD (generalized anxiety disorder) Stress management  4. Panic attacks - escitalopram (LEXAPRO) 20 MG tablet; Take 1 tablet (20 mg total) by mouth daily.  Dispense: 90 tablet;  Refill: 1  5. Smoker Smoking cessation encouraged Ordered chantix  6. BMI 33.0-33.9,adult Discussed diet and exercise for person with BMI >25 Will recheck weight in 3-6 months   7. Peripheral edema Elevate legs when sitting - furosemide (LASIX) 40 MG tablet; Take 1 tablet (40 mg total) by mouth daily.  Dispense: 90 tablet; Refill: 1  8. Prostate cancer screening - PSA, total and free   Labs pending Health Maintenance reviewed Diet and exercise encouraged  Follow up plan: 6 months   [name], FNP

## 2023-05-01 NOTE — Patient Instructions (Signed)

## 2023-05-02 LAB — CBC WITH DIFFERENTIAL/PLATELET
Basophils Absolute: 0.1 10*3/uL (ref 0.0–0.2)
Basos: 1 %
EOS (ABSOLUTE): 0.3 10*3/uL (ref 0.0–0.4)
Eos: 4 %
Hematocrit: 45.3 % (ref 37.5–51.0)
Hemoglobin: 15.3 g/dL (ref 13.0–17.7)
Immature Grans (Abs): 0 10*3/uL (ref 0.0–0.1)
Immature Granulocytes: 0 %
Lymphocytes Absolute: 2 10*3/uL (ref 0.7–3.1)
Lymphs: 25 %
MCH: 31.3 pg (ref 26.6–33.0)
MCHC: 33.8 g/dL (ref 31.5–35.7)
MCV: 93 fL (ref 79–97)
Monocytes Absolute: 0.6 10*3/uL (ref 0.1–0.9)
Monocytes: 8 %
Neutrophils Absolute: 5 10*3/uL (ref 1.4–7.0)
Neutrophils: 62 %
Platelets: 219 10*3/uL (ref 150–450)
RBC: 4.89 x10E6/uL (ref 4.14–5.80)
RDW: 11.5 % — ABNORMAL LOW (ref 11.6–15.4)
WBC: 8.1 10*3/uL (ref 3.4–10.8)

## 2023-05-02 LAB — PSA, TOTAL AND FREE
PSA, Free Pct: 51.7 %
PSA, Free: 0.31 ng/mL
Prostate Specific Ag, Serum: 0.6 ng/mL (ref 0.0–4.0)

## 2023-05-02 LAB — CMP14+EGFR
ALT: 32 [IU]/L (ref 0–44)
AST: 20 [IU]/L (ref 0–40)
Albumin: 4.4 g/dL (ref 3.8–4.9)
Alkaline Phosphatase: 99 [IU]/L (ref 44–121)
BUN/Creatinine Ratio: 15 (ref 9–20)
BUN: 13 mg/dL (ref 6–24)
Bilirubin Total: 0.7 mg/dL (ref 0.0–1.2)
CO2: 26 mmol/L (ref 20–29)
Calcium: 9.3 mg/dL (ref 8.7–10.2)
Chloride: 97 mmol/L (ref 96–106)
Creatinine, Ser: 0.85 mg/dL (ref 0.76–1.27)
Globulin, Total: 2.5 g/dL (ref 1.5–4.5)
Glucose: 109 mg/dL — ABNORMAL HIGH (ref 70–99)
Potassium: 4.7 mmol/L (ref 3.5–5.2)
Sodium: 139 mmol/L (ref 134–144)
Total Protein: 6.9 g/dL (ref 6.0–8.5)
eGFR: 102 mL/min/{1.73_m2} (ref >59)

## 2023-05-02 LAB — LIPID PANEL
Chol/HDL Ratio: 4.3 ratio (ref 0.0–5.0)
Cholesterol, Total: 138 mg/dL (ref 100–199)
HDL: 32 mg/dL — ABNORMAL LOW (ref >39)
LDL Chol Calc (NIH): 68 mg/dL (ref 0–99)
Triglycerides: 231 mg/dL — ABNORMAL HIGH (ref 0–149)
VLDL Cholesterol Cal: 38 mg/dL (ref 5–40)

## 2023-09-03 ENCOUNTER — Encounter: Payer: Self-pay | Admitting: *Deleted

## 2023-10-23 ENCOUNTER — Ambulatory Visit: Payer: 59 | Admitting: Nurse Practitioner

## 2023-10-30 ENCOUNTER — Ambulatory Visit: Payer: 59 | Admitting: Nurse Practitioner

## 2023-10-30 ENCOUNTER — Ambulatory Visit (INDEPENDENT_AMBULATORY_CARE_PROVIDER_SITE_OTHER)

## 2023-10-30 ENCOUNTER — Encounter: Payer: Self-pay | Admitting: Nurse Practitioner

## 2023-10-30 VITALS — BP 111/74 | HR 60 | Temp 96.9°F | Ht 73.0 in | Wt 255.0 lb

## 2023-10-30 DIAGNOSIS — Z0000 Encounter for general adult medical examination without abnormal findings: Secondary | ICD-10-CM

## 2023-10-30 DIAGNOSIS — R600 Localized edema: Secondary | ICD-10-CM

## 2023-10-30 DIAGNOSIS — F17200 Nicotine dependence, unspecified, uncomplicated: Secondary | ICD-10-CM | POA: Diagnosis not present

## 2023-10-30 DIAGNOSIS — Z0001 Encounter for general adult medical examination with abnormal findings: Secondary | ICD-10-CM

## 2023-10-30 DIAGNOSIS — F411 Generalized anxiety disorder: Secondary | ICD-10-CM | POA: Diagnosis not present

## 2023-10-30 DIAGNOSIS — F410 Panic disorder [episodic paroxysmal anxiety] without agoraphobia: Secondary | ICD-10-CM

## 2023-10-30 DIAGNOSIS — I10 Essential (primary) hypertension: Secondary | ICD-10-CM

## 2023-10-30 DIAGNOSIS — E7800 Pure hypercholesterolemia, unspecified: Secondary | ICD-10-CM

## 2023-10-30 DIAGNOSIS — Z6833 Body mass index (BMI) 33.0-33.9, adult: Secondary | ICD-10-CM

## 2023-10-30 LAB — CBC WITH DIFFERENTIAL/PLATELET
Basophils Absolute: 0.1 10*3/uL (ref 0.0–0.2)
Basos: 1 %
EOS (ABSOLUTE): 0.2 10*3/uL (ref 0.0–0.4)
Eos: 3 %
Hematocrit: 44.4 % (ref 37.5–51.0)
Hemoglobin: 14.8 g/dL (ref 13.0–17.7)
Immature Grans (Abs): 0 10*3/uL (ref 0.0–0.1)
Immature Granulocytes: 0 %
Lymphocytes Absolute: 2 10*3/uL (ref 0.7–3.1)
Lymphs: 25 %
MCH: 30.5 pg (ref 26.6–33.0)
MCHC: 33.3 g/dL (ref 31.5–35.7)
MCV: 91 fL (ref 79–97)
Monocytes Absolute: 0.6 10*3/uL (ref 0.1–0.9)
Monocytes: 8 %
Neutrophils Absolute: 4.9 10*3/uL (ref 1.4–7.0)
Neutrophils: 63 %
Platelets: 207 10*3/uL (ref 150–450)
RBC: 4.86 x10E6/uL (ref 4.14–5.80)
RDW: 12.2 % (ref 11.6–15.4)
WBC: 7.7 10*3/uL (ref 3.4–10.8)

## 2023-10-30 LAB — CMP14+EGFR
ALT: 31 IU/L (ref 0–44)
AST: 23 IU/L (ref 0–40)
Albumin: 4.6 g/dL (ref 3.8–4.9)
Alkaline Phosphatase: 113 IU/L (ref 44–121)
BUN/Creatinine Ratio: 9 (ref 9–20)
BUN: 7 mg/dL (ref 6–24)
Bilirubin Total: 0.6 mg/dL (ref 0.0–1.2)
CO2: 25 mmol/L (ref 20–29)
Calcium: 9.3 mg/dL (ref 8.7–10.2)
Chloride: 98 mmol/L (ref 96–106)
Creatinine, Ser: 0.82 mg/dL (ref 0.76–1.27)
Globulin, Total: 2.1 g/dL (ref 1.5–4.5)
Glucose: 98 mg/dL (ref 70–99)
Potassium: 4 mmol/L (ref 3.5–5.2)
Sodium: 137 mmol/L (ref 134–144)
Total Protein: 6.7 g/dL (ref 6.0–8.5)
eGFR: 103 mL/min/{1.73_m2} (ref >59)

## 2023-10-30 LAB — LIPID PANEL
Chol/HDL Ratio: 3.8 ratio (ref 0.0–5.0)
Cholesterol, Total: 127 mg/dL (ref 100–199)
HDL: 33 mg/dL — ABNORMAL LOW (ref >39)
LDL Chol Calc (NIH): 70 mg/dL (ref 0–99)
Triglycerides: 134 mg/dL (ref 0–149)
VLDL Cholesterol Cal: 24 mg/dL (ref 5–40)

## 2023-10-30 MED ORDER — OLMESARTAN MEDOXOMIL 20 MG PO TABS: 20.0000 mg | ORAL_TABLET | Freq: Every day | ORAL | 1 refills | Status: DC

## 2023-10-30 MED ORDER — ATORVASTATIN CALCIUM 40 MG PO TABS: 40.0000 mg | ORAL_TABLET | Freq: Every day | ORAL | 1 refills | Status: DC

## 2023-10-30 MED ORDER — FUROSEMIDE 40 MG PO TABS: 40.0000 mg | ORAL_TABLET | Freq: Every day | ORAL | 1 refills | Status: DC

## 2023-10-30 MED ORDER — ESCITALOPRAM OXALATE 20 MG PO TABS: 20.0000 mg | ORAL_TABLET | Freq: Every day | ORAL | 1 refills | Status: DC

## 2023-10-30 NOTE — Addendum Note (Signed)
 Addended by: Cherylyn Cos on: 10/30/2023 10:01 AM   Modules accepted: Orders

## 2023-10-30 NOTE — Progress Notes (Signed)
 Subjective:    Patient ID: Rafi Kenneth, male    DOB: [birth year], 58 y.o.   MRN: [id number]   Chief Complaint: annual physcial   HPI:  Rainer Mounce is a 58 y.o. who identifies as a male who was assigned male at birth.   Social history: Lives with: wife and daughters   Comes in today for follow up of the following chronic medical issues:  1. Primary hypertension No c/o chest pain, sob or headache. Does not check blood pressure at home BP Readings from Last 3 Encounters:  05/01/23 118/70  10/24/22 111/70  09/30/22 137/83     2. Pure hypercholesterolemia Does try to watch diet but does no dedicated exercise. Lab Results  Component Value Date   CHOL 138 05/01/2023   HDL 32 (L) 05/01/2023   LDLCALC 68 05/01/2023   TRIG 231 (H) 05/01/2023   CHOLHDL 4.3 05/01/2023        3. GAD (generalized anxiety disorder) 4. Panic attacks Is on lexapro  and is doing well    10/30/2023    9:17 AM 05/01/2023    9:19 AM 10/24/2022    9:38 AM 09/30/2022   10:12 AM  GAD 7 : Generalized Anxiety Score  Nervous, Anxious, on Edge 0 1 1 0  Control/stop worrying 0 0 1 0  Worry too much - different things 0 1 1 0  Trouble relaxing 0 1 0 0  Restless 0 0 0 0  Easily annoyed or irritable 0 0 0 0  Afraid - awful might happen 0 0 0 0  Total GAD 7 Score 0 3 3 0  Anxiety Difficulty Not difficult at all Not difficult at all Not difficult at all Not difficult at all     5. Smoker Smokes at least 1/ pack a day.  6. BMI 33.0-33.9 No recent weight changes Wt Readings from Last 3 Encounters:  10/30/23 255 lb (115.7 kg)  05/01/23 255 lb 8 oz (115.9 kg)  10/24/22 252 lb (114.3 kg)   BMI Readings from Last 3 Encounters:  10/30/23 33.64 kg/m  05/01/23 33.71 kg/m  10/24/22 33.25 kg/m       New complaints: None today  No Known Allergies Outpatient Encounter Medications as of 10/30/2023  Medication Sig   albuterol  (VENTOLIN  HFA) 108 (90 Base) MCG/ACT inhaler INHALE 2 PUFFS  INTO THE LUNGS EVERY 6 HOURS AS NEEDED FOR WHEEZE OR SHORTNESS OF BREATH   atorvastatin  (LIPITOR) 40 MG tablet Take 1 tablet (40 mg total) by mouth daily.   escitalopram  (LEXAPRO ) 20 MG tablet Take 1 tablet (20 mg total) by mouth daily.   furosemide  (LASIX ) 40 MG tablet Take 1 tablet (40 mg total) by mouth daily.   olmesartan  (BENICAR ) 20 MG tablet Take 1 tablet (20 mg total) by mouth daily.   varenicline  (CHANTIX  CONTINUING MONTH PAK) 1 MG tablet Take 1 tablet (1 mg total) by mouth 2 (two) times daily.   Varenicline  Tartrate, Starter, (CHANTIX  STARTING MONTH PAK) 0.5 MG X 11 & 1 MG X 42 TBPK As indicated on package   No facility-administered encounter medications on file as of 10/30/2023.    Past Surgical History:  Procedure Laterality Date   EYE SURGERY     HERNIA REPAIR      Family History  Adopted: Yes  Problem Relation Age of Onset   Cancer Father       Controlled substance contract: n/a      Review of Systems  Constitutional:  Negative for diaphoresis.  Eyes:  Negative for pain.  Respiratory:  Negative for shortness of breath.   Cardiovascular:  Negative for chest pain, palpitations and leg swelling.  Gastrointestinal:  Negative for abdominal pain.  Endocrine: Negative for polydipsia.  Skin:  Negative for rash.  Neurological:  Negative for dizziness, weakness and headaches.  Hematological:  Does not bruise/bleed easily.  All other systems reviewed and are negative.      Objective:   Physical Exam Vitals and nursing note reviewed.  Constitutional:      Appearance: Normal appearance. He is well-developed.  HENT:     Head: Normocephalic.     Nose: Nose normal.     Mouth/Throat:     Mouth: Mucous membranes are moist.     Pharynx: Oropharynx is clear.  Eyes:     Pupils: Pupils are equal, round, and reactive to light.  Neck:     Thyroid: No thyroid mass or thyromegaly.     Vascular: No carotid bruit or JVD.     Trachea: Phonation normal.  Cardiovascular:      Rate and Rhythm: Normal rate and regular rhythm.  Pulmonary:     Effort: Pulmonary effort is normal. No respiratory distress.     Breath sounds: Normal breath sounds.  Abdominal:     General: Bowel sounds are normal.     Palpations: Abdomen is soft.     Tenderness: There is no abdominal tenderness.  Musculoskeletal:        General: Normal range of motion.     Cervical back: Normal range of motion and neck supple.  Lymphadenopathy:     Cervical: No cervical adenopathy.  Skin:    General: Skin is warm and dry.  Neurological:     Mental Status: He is alert and oriented to person, place, and time.  Psychiatric:        Behavior: Behavior normal.        Thought Content: Thought content normal.        Judgment: Judgment normal.    BP 111/74   Pulse 60   Temp (!) 96.9 F (36.1 C) (Temporal)   Ht 6\' 1"  (1.854 m)   Wt 255 lb (115.7 kg)   SpO2 100%   BMI 33.64 kg/m          Assessment & Plan:   Wildon Cuevas comes in today with chief complaint of annual physical  Diagnosis and orders addressed:  1. Primary hypertension Low sodium diet - CBC with Differential/Platelet - CMP14+EGFR - olmesartan  (BENICAR ) 20 MG tablet; Take 1 tablet (20 mg total) by mouth daily.  Dispense: 90 tablet; Refill: 1  2. Pure hypercholesterolemia Low fta diet - Lipid panel - atorvastatin  (LIPITOR) 40 MG tablet; Take 1 tablet (40 mg total) by mouth daily.  Dispense: 90 tablet; Refill: 1  3. BMI 33.0-33.9,adult Discussed diet and exercise for person with BMI >25 Will recheck weight in 3-6 months   4. GAD (generalized anxiety disorder) Stress management  5. Panic attacks - escitalopram  (LEXAPRO ) 20 MG tablet; Take 1 tablet (20 mg total) by mouth daily.  Dispense: 90 tablet; Refill: 1  6. Smoker Smoking cessation  7. Prostate cancer screening - PSA, total and free  8. Peripheral edema Elevate legs when sitting - furosemide  (LASIX ) 40 MG tablet; Take 1 tablet (40 mg total) by  mouth daily.  Dispense: 90 tablet; Refill: 1   Labs pending Health Maintenance reviewed Diet and exercise encouraged  Follow up plan: 6 months   Mary-Margaret Gaylyn Keas, FNP

## 2023-12-05 ENCOUNTER — Telehealth: Admitting: Family Medicine

## 2023-12-05 ENCOUNTER — Ambulatory Visit: Payer: Self-pay | Admitting: Family Medicine

## 2023-12-05 ENCOUNTER — Encounter: Payer: Self-pay | Admitting: Family Medicine

## 2023-12-05 DIAGNOSIS — B9689 Other specified bacterial agents as the cause of diseases classified elsewhere: Secondary | ICD-10-CM | POA: Diagnosis not present

## 2023-12-05 DIAGNOSIS — F17210 Nicotine dependence, cigarettes, uncomplicated: Secondary | ICD-10-CM

## 2023-12-05 DIAGNOSIS — J208 Acute bronchitis due to other specified organisms: Secondary | ICD-10-CM | POA: Diagnosis not present

## 2023-12-05 DIAGNOSIS — F17200 Nicotine dependence, unspecified, uncomplicated: Secondary | ICD-10-CM

## 2023-12-05 MED ORDER — AZITHROMYCIN 250 MG PO TABS: ORAL_TABLET | ORAL | 0 refills | Status: DC

## 2023-12-05 MED ORDER — ALBUTEROL SULFATE HFA 108 (90 BASE) MCG/ACT IN AERS: 2.0000 | INHALATION_SPRAY | Freq: Four times a day (QID) | RESPIRATORY_TRACT | 0 refills | Status: DC | PRN

## 2023-12-05 MED ORDER — PREDNISONE 20 MG PO TABS: 40.0000 mg | ORAL_TABLET | Freq: Every day | ORAL | 0 refills | Status: AC

## 2023-12-05 NOTE — Progress Notes (Signed)
 MyChart Video visit  Subjective: CC:URI PCP: Delfina Feller, FNP [id number] Vandenberghe is a 58 y.o. male. Patient provides verbal consent for consult held via video.  Due to COVID-19 pandemic this visit was conducted virtually. This visit type was conducted due to national recommendations for restrictions regarding the COVID-19 Pandemic (e.g. social distancing, sheltering in place) in an effort to limit this patient's exposure and mitigate transmission in our community. All issues noted in this document were discussed and addressed.  A physical exam was not performed with this format.   Location of patient: work Location of provider: [facility] Others present for call: none  1. ?Bronchitis Patient reports he's had recurrent bronchitis.  Started about 1 week ago with excessive drainage but yesterday he felt like the cough got worse and caused some throat irritation.  He reports NO brown or bloody sputum.  Reports post nasal drainage.  Reports wheezing. No fever. No shortness breath. He has been using albuterol  at home and it helps some.  He is using flonase , claritin.  Current daily smoker 1 ppd.  No unplanned weight loss, night sweats, chest pain.    ROS: Per HPI  No Known Allergies Past Medical History:  Diagnosis Date   Anxiety     Current Outpatient Medications:    albuterol  (VENTOLIN  HFA) 108 (90 Base) MCG/ACT inhaler, INHALE 2 PUFFS INTO THE LUNGS EVERY 6 HOURS AS NEEDED FOR WHEEZE OR SHORTNESS OF BREATH, Disp: 6.7 each, Rfl: 2   atorvastatin  (LIPITOR) 40 MG tablet, Take 1 tablet (40 mg total) by mouth daily., Disp: 90 tablet, Rfl: 1   escitalopram  (LEXAPRO ) 20 MG tablet, Take 1 tablet (20 mg total) by mouth daily., Disp: 90 tablet, Rfl: 1   furosemide  (LASIX ) 40 MG tablet, Take 1 tablet (40 mg total) by mouth daily., Disp: 90 tablet, Rfl: 1   olmesartan  (BENICAR ) 20 MG tablet, Take 1 tablet (20 mg total) by mouth daily., Disp: 90 tablet, Rfl: 1   varenicline  (CHANTIX  CONTINUING  MONTH PAK) 1 MG tablet, Take 1 tablet (1 mg total) by mouth 2 (two) times daily. (Patient not taking: Reported on 10/30/2023), Disp: 60 tablet, Rfl: 3   Varenicline  Tartrate, Starter, (CHANTIX  STARTING MONTH PAK) 0.5 MG X 11 & 1 MG X 42 TBPK, As indicated on package (Patient not taking: Reported on 10/30/2023), Disp: 1 each, Rfl: 0  Gen: Nontoxic male in no acute distress Pulm: Normal work of breathing on room air.  No observed wheezing, dyspnea with speech or coughing  Assessment/ Plan: 58 y.o. male   Acute bacterial bronchitis - Plan: azithromycin  (ZITHROMAX ) 250 MG tablet, predniSONE  (DELTASONE ) 20 MG tablet, albuterol  (VENTOLIN  HFA) 108 (90 Base) MCG/ACT inhaler  Smoker - Plan: Ambulatory Referral Lung Cancer Screening [facility]  Going to treat him for acute bacterial bronchitis.  I suspect that he probably has some level of underlying emphysema or COPD.  Looks like his PCP tried to get him a CT lung cancer screening done back in May April physical but there was some issue with it so have placed a referral to the lung cancer screening center and hopefully they can take this over and get that pushed through his insurance.  We discussed red flag signs and symptoms and reasons for reevaluation he voiced good understanding will follow-up as needed  Start time: 1:10pm End time: 1:19pm  Total time spent on patient care (including video visit/ documentation): 10 minutes  Liboria Putnam Bambi Bonine, DO [facility] [phone]

## 2023-12-10 ENCOUNTER — Telehealth: Payer: Self-pay | Admitting: Nurse Practitioner

## 2023-12-10 NOTE — Telephone Encounter (Signed)
 Please have him come in for recheck with a provider.  He will need a chest xray if he is feeling worse.

## 2023-12-11 ENCOUNTER — Ambulatory Visit: Admitting: Family Medicine

## 2023-12-11 ENCOUNTER — Encounter: Payer: Self-pay | Admitting: Family Medicine

## 2023-12-11 VITALS — BP 98/53 | HR 62 | Temp 97.8°F | Ht 73.0 in | Wt 251.8 lb

## 2023-12-11 DIAGNOSIS — J208 Acute bronchitis due to other specified organisms: Secondary | ICD-10-CM

## 2023-12-11 DIAGNOSIS — B9689 Other specified bacterial agents as the cause of diseases classified elsewhere: Secondary | ICD-10-CM | POA: Diagnosis not present

## 2023-12-11 MED ORDER — DOXYCYCLINE HYCLATE 100 MG PO TABS: 100.0000 mg | ORAL_TABLET | Freq: Two times a day (BID) | ORAL | 0 refills | Status: AC

## 2023-12-11 MED ORDER — PREDNISONE 20 MG PO TABS
40.0000 mg | ORAL_TABLET | Freq: Every day | ORAL | 0 refills | Status: AC
Start: 2023-12-11 — End: 2023-12-16

## 2023-12-11 NOTE — Progress Notes (Signed)
 Acute Office Visit  Subjective:     Patient ID: Donald Reeves, male    DOB: [birth year], 58 y.o.   MRN: [id number]  Chief Complaint  Patient presents with   Cough    Cough This is a new problem. Episode onset: 1 week. The problem has been unchanged. The problem occurs every few minutes. The cough is Productive of sputum (intermittently, white). Associated symptoms include headaches, nasal congestion, postnasal drip, shortness of breath (with cough fits), sweats (with coughing fits) and wheezing (with coughing fits). Pertinent negatives include no chest pain, chills, ear congestion, ear pain, fever, hemoptysis or sore throat. Risk factors for lung disease include smoking/tobacco exposure. He has tried a beta-agonist inhaler and oral steroids (azithromycin ) for the symptoms. The treatment provided no relief. His past medical history is significant for bronchitis. There is no history of asthma, COPD or pneumonia.   Review of Systems  Constitutional:  Negative for chills and fever.  HENT:  Positive for postnasal drip. Negative for ear pain and sore throat.   Respiratory:  Positive for cough, shortness of breath (with cough fits) and wheezing (with coughing fits). Negative for hemoptysis.   Cardiovascular:  Negative for chest pain.  Neurological:  Positive for headaches.        Objective:    BP (!) 98/53   Pulse 62   Temp 97.8 F (36.6 C) (Temporal)   Ht 6\' 1"  (1.854 m)   Wt 251 lb 12.8 oz (114.2 kg)   SpO2 95%   BMI 33.22 kg/m    Physical Exam Vitals and nursing note reviewed.  Constitutional:      General: He is not in acute distress.    Appearance: He is not ill-appearing, toxic-appearing or diaphoretic.  HENT:     Right Ear: Tympanic membrane, ear canal and external ear normal.     Left Ear: Tympanic membrane, ear canal and external ear normal.     Nose: Congestion present.     Mouth/Throat:     Mouth: Mucous membranes are moist.     Pharynx: Oropharynx is clear. No  oropharyngeal exudate or posterior oropharyngeal erythema.  Cardiovascular:     Rate and Rhythm: Normal rate and regular rhythm.     Heart sounds: Normal heart sounds. No murmur heard. Pulmonary:     Effort: Pulmonary effort is normal.     Breath sounds: Examination of the left-upper field reveals rhonchi. Examination of the left-middle field reveals rhonchi. Examination of the right-lower field reveals wheezing and rhonchi. Examination of the left-lower field reveals wheezing and rhonchi. Wheezing and rhonchi present.  Musculoskeletal:     Cervical back: Neck supple.     Right lower leg: No edema.     Left lower leg: No edema.  Lymphadenopathy:     Cervical: No cervical adenopathy.  Skin:    General: Skin is warm and dry.  Neurological:     General: No focal deficit present.     Mental Status: He is alert and oriented to person, place, and time.  Psychiatric:        Mood and Affect: Mood normal.        Behavior: Behavior normal.     No results found for any visits on 12/11/23.      Assessment & Plan:   Rakim was seen today for cough.  Diagnoses and all orders for this visit:  Acute bacterial bronchitis Reviewed OV note from 12/05/23. No improvement with zpak and prednisone  burst. Lungs with rhonchi and wheezes  at base. On xray available today. Will treat with doxycyline and prednisone  for possible CAP. Continue albuterol  prn. Mucinex  prn. Strict return precautions given.  -     predniSONE  (DELTASONE ) 20 MG tablet; Take 2 tablets (40 mg total) by mouth daily with breakfast for 5 days. -     doxycycline  (VIBRA -TABS) 100 MG tablet; Take 1 tablet (100 mg total) by mouth 2 (two) times daily for 7 days.   Return if symptoms worsen or fail to improve.  The patient indicates understanding of these issues and agrees with the plan.   Albertha Huger, FNP

## 2023-12-11 NOTE — Telephone Encounter (Signed)
 Appointment scheduled for 12/11/2023

## 2024-01-01 ENCOUNTER — Other Ambulatory Visit: Payer: Self-pay | Admitting: Family Medicine

## 2024-01-01 DIAGNOSIS — B9689 Other specified bacterial agents as the cause of diseases classified elsewhere: Secondary | ICD-10-CM

## 2024-01-16 ENCOUNTER — Telehealth: Payer: Self-pay | Admitting: Acute Care

## 2024-01-16 DIAGNOSIS — Z87891 Personal history of nicotine dependence: Secondary | ICD-10-CM

## 2024-01-16 DIAGNOSIS — F17210 Nicotine dependence, cigarettes, uncomplicated: Secondary | ICD-10-CM

## 2024-01-16 DIAGNOSIS — Z122 Encounter for screening for malignant neoplasm of respiratory organs: Secondary | ICD-10-CM

## 2024-01-16 NOTE — Telephone Encounter (Signed)
 Lung Cancer Screening Narrative/Criteria Questionnaire (Cigarette Smokers Only- No Cigars/Pipes/vapes)   Donald Reeves   SDMV:02/04/2024 1030 [name]       12-Jan-1966   LDCT: 02/05/2024 8:00a GI    58 y.o.   Phone: [phone]  Lung Screening Narrative (confirm age 31-77 yrs Medicare / 50-80 yrs Private pay insurance)   Insurance information:[employer]    Referring Provider:Mary-Margaret Gladis   This screening involves an initial phone call with a team member from our program. It is called a shared decision making visit. The initial meeting is required by  insurance and Medicare to make sure you understand the program. This appointment takes about 15-20 minutes to complete. You will complete the screening scan at your scheduled date/time.  This scan takes about 5-10 minutes to complete. You can eat and drink normally before and after the scan.  Criteria questions for Lung Cancer Screening:   Are you a current or former smoker? Current Age began smoking: 58yo   If you are a former smoker, what year did you quit smoking? N/A(within 15 yrs)   To calculate your smoking history, I need an accurate estimate of how many packs of cigarettes you smoked per day and for how many years. (Not just the number of PPD you are now smoking)   Years smoking 37 x Packs per day 1 = Pack years 37   (at least 20 pack yrs)   (Make sure they understand that we need to know how much they have smoked in the past, not just the number of PPD they are smoking now)  Do you have a personal history of cancer?  No    Do you have a family history of cancer? No  Are you coughing up blood?  No  Have you had unexplained weight loss of 15 lbs or more in the last 6 months? No  It looks like you meet all criteria.  When would be a good time for us  to schedule you for this screening?   Additional information: N/A

## 2024-02-03 ENCOUNTER — Other Ambulatory Visit: Payer: Self-pay | Admitting: Nurse Practitioner

## 2024-02-03 DIAGNOSIS — B9689 Other specified bacterial agents as the cause of diseases classified elsewhere: Secondary | ICD-10-CM

## 2024-02-04 ENCOUNTER — Ambulatory Visit: Admitting: *Deleted

## 2024-02-04 ENCOUNTER — Encounter: Payer: Self-pay | Admitting: *Deleted

## 2024-02-04 DIAGNOSIS — F17210 Nicotine dependence, cigarettes, uncomplicated: Secondary | ICD-10-CM

## 2024-02-04 NOTE — Progress Notes (Unsigned)
  Virtual Visit via Telephone Note  I connected with Donald Reeves on 02/04/24 at 10:30 AM EDT by telephone and verified that I am speaking with the correct person using two identifiers.  Location: Patient: Donald Reeves Provider: Laneta Speaks, RN   I discussed the limitations, risks, security and privacy concerns of performing an evaluation and management service by telephone and the availability of in person appointments. I also discussed with the patient that there may be a patient responsible charge related to this service. The patient expressed understanding and agreed to proceed.   Shared Decision Making Visit Lung Cancer Screening Program [phone])   Eligibility: Age 6 y.o. Pack Years Smoking History Calculation 37 (# packs/per year x # years smoked) Recent History of coughing up blood  no Unexplained weight loss? no ( >Than 15 pounds within the last 6 months ) Prior History Lung / other cancer no (Diagnosis within the last 5 years already requiring surveillance chest CT Scans). Smoking Status Current Smoker Former Smokers: Years since quit: n/a  Quit Date: n/a  Visit Components: Discussion included one or more decision making aids. yes Discussion included risk/benefits of screening. yes Discussion included potential follow up diagnostic testing for abnormal scans. yes Discussion included meaning and risk of over diagnosis. yes Discussion included meaning and risk of False Positives. yes Discussion included meaning of total radiation exposure. yes  Counseling Included: Importance of adherence to annual lung cancer LDCT screening. yes Impact of comorbidities on ability to participate in the program. yes Ability and willingness to under diagnostic treatment. yes  Smoking Cessation Counseling: Current Smokers:  Discussed importance of smoking cessation. yes Information about tobacco cessation classes and interventions provided to patient. yes Patient provided  with ticket for LDCT Scan. no Symptomatic Patient. no  Counseling(Intermediate counseling: > three minutes) 99406 Diagnosis Code: Tobacco Use Z72.0 Asymptomatic Patient yes  Counseling (Intermediate counseling: > three minutes counseling) H9563 Former Smokers:  Discussed the importance of maintaining cigarette abstinence. yes Diagnosis Code: Personal History of Nicotine Dependence. S12.108 Information about tobacco cessation classes and interventions provided to patient. Yes Patient provided with ticket for LDCT Scan. no Written Order for Lung Cancer Screening with LDCT placed in Epic. Yes (CT Chest Lung Cancer Screening Low Dose W/O CM) [id number] Z12.2-Screening of respiratory organs Z87.891-Personal history of nicotine dependence   Laneta Speaks, RN

## 2024-02-04 NOTE — Patient Instructions (Signed)

## 2024-02-05 ENCOUNTER — Ambulatory Visit
Admission: RE | Admit: 2024-02-05 | Discharge: 2024-02-05 | Disposition: A | Discharge status: Home / Self Care | Source: Ambulatory Visit | Attending: Acute Care | Admitting: Acute Care

## 2024-02-05 DIAGNOSIS — Z122 Encounter for screening for malignant neoplasm of respiratory organs: Secondary | ICD-10-CM

## 2024-02-05 DIAGNOSIS — Z87891 Personal history of nicotine dependence: Secondary | ICD-10-CM

## 2024-02-05 DIAGNOSIS — F17210 Nicotine dependence, cigarettes, uncomplicated: Secondary | ICD-10-CM

## 2024-02-16 ENCOUNTER — Other Ambulatory Visit: Payer: Self-pay | Admitting: Acute Care

## 2024-02-16 DIAGNOSIS — Z122 Encounter for screening for malignant neoplasm of respiratory organs: Secondary | ICD-10-CM

## 2024-02-16 DIAGNOSIS — F17210 Nicotine dependence, cigarettes, uncomplicated: Secondary | ICD-10-CM

## 2024-02-16 DIAGNOSIS — Z87891 Personal history of nicotine dependence: Secondary | ICD-10-CM

## 2024-02-19 ENCOUNTER — Telehealth: Payer: Self-pay | Admitting: Nurse Practitioner

## 2024-02-19 NOTE — Telephone Encounter (Signed)
 Spoke with pt. He is aware the pulmonologist ordered the CT scan. I told pt I can send a message to see if MMM is able to look at it but normally the provider that orders the tests are the ones that review it. Pt verbalized understanding it and states if MMM is not able to review it he understands and can reach out to pulmonology.

## 2024-02-19 NOTE — Telephone Encounter (Signed)
 Pt wife has questions regarding CT Scan

## 2024-02-24 NOTE — Progress Notes (Signed)
 Spent 3 1/2 minutes intermediate counseling patient on importance of smoking cessation.

## 2024-03-17 ENCOUNTER — Telehealth: Admitting: Physician Assistant

## 2024-03-17 DIAGNOSIS — J208 Acute bronchitis due to other specified organisms: Secondary | ICD-10-CM | POA: Diagnosis not present

## 2024-03-17 DIAGNOSIS — B9689 Other specified bacterial agents as the cause of diseases classified elsewhere: Secondary | ICD-10-CM | POA: Diagnosis not present

## 2024-03-17 MED ORDER — PROMETHAZINE-DM 6.25-15 MG/5ML PO SYRP: 5.0000 mL | ORAL_SOLUTION | Freq: Four times a day (QID) | ORAL | 0 refills | Status: DC | PRN

## 2024-03-17 MED ORDER — DOXYCYCLINE HYCLATE 100 MG PO TABS: 100.0000 mg | ORAL_TABLET | Freq: Two times a day (BID) | ORAL | 0 refills | Status: DC

## 2024-03-17 NOTE — Progress Notes (Signed)
 Virtual Visit Consent   Donald Reeves, you are scheduled for a virtual visit with a [facility] provider today. Just as with appointments in the office, your consent must be obtained to participate. Your consent will be active for this visit and any virtual visit you may have with one of our providers in the next 365 days. If you have a MyChart account, a copy of this consent can be sent to you electronically.  As this is a virtual visit, video technology does not allow for your provider to perform a traditional examination. This may limit your provider's ability to fully assess your condition. If your provider identifies any concerns that need to be evaluated in person or the need to arrange testing (such as labs, EKG, etc.), we will make arrangements to do so. Although advances in technology are sophisticated, we cannot ensure that it will always work on either your end or our end. If the connection with a video visit is poor, the visit may have to be switched to a telephone visit. With either a video or telephone visit, we are not always able to ensure that we have a secure connection.  By engaging in this virtual visit, you consent to the provision of healthcare and authorize for your insurance to be billed (if applicable) for the services provided during this visit. Depending on your insurance coverage, you may receive a charge related to this service.  I need to obtain your verbal consent now. Are you willing to proceed with your visit today? Donald Reeves has provided verbal consent on 03/17/2024 for a virtual visit (video or telephone). Elsie Velma Lunger, [place]  Date: 03/17/2024 2:03 PM   Virtual Visit via Video Note   I, Elsie Velma Lunger, connected with  Donald Reeves  ([id number], 01-10-1966) on 03/17/24 at  2:00 PM EDT by a video-enabled telemedicine application and verified that I am speaking with the correct person using two identifiers.  Location: Patient: Virtual Visit  Location Patient: Home Provider: Virtual Visit Location Provider: Home Office   I discussed the limitations of evaluation and management by telemedicine and the availability of in person appointments. The patient expressed understanding and agreed to proceed.    History of Present Illness: Donald Reeves is a 58 y.o. who identifies as a male who was assigned male at birth, and is being seen today for URI symptoms starting over the past week with progressively worsening chest congestion, productive cough and fatigue.  Denies fever, chills. Some mild SOB alleviated with use of his albuterol  inhaler. Denies recent travel or known sick contact.   OTC -- Benadryl, Mucinex    HPI: HPI  Problems:  Patient Active Problem List   Diagnosis Date Noted   BMI 33.0-33.9,adult 12/14/2019   Hyperlipidemia 12/14/2019   Smoker 03/26/2018   HTN (hypertension) 12/24/2016   Panic attacks 09/09/2013   GAD (generalized anxiety disorder) 09/09/2013    Allergies: No Known Allergies Medications:  Current Outpatient Medications:    doxycycline  (VIBRA -TABS) 100 MG tablet, Take 1 tablet (100 mg total) by mouth 2 (two) times daily., Disp: 14 tablet, Rfl: 0   promethazine -dextromethorphan (PROMETHAZINE -DM) 6.25-15 MG/5ML syrup, Take 5 mLs by mouth 4 (four) times daily as needed for cough., Disp: 118 mL, Rfl: 0   Varenicline  Tartrate, Starter, (CHANTIX  STARTING MONTH PAK) 0.5 MG X 11 & 1 MG X 42 TBPK, Take by mouth., Disp: , Rfl:    albuterol  (VENTOLIN  HFA) 108 (90 Base) MCG/ACT inhaler, INHALE 2 PUFFS INTO  THE LUNGS EVERY 6 HOURS AS NEEDED FOR WHEEZE OR SHORTNESS OF BREATH, Disp: 6.7 each, Rfl: 2   atorvastatin  (LIPITOR) 40 MG tablet, Take 1 tablet (40 mg total) by mouth daily., Disp: 90 tablet, Rfl: 1   escitalopram  (LEXAPRO ) 20 MG tablet, Take 1 tablet (20 mg total) by mouth daily., Disp: 90 tablet, Rfl: 1   furosemide  (LASIX ) 40 MG tablet, Take 1 tablet (40 mg total) by mouth daily., Disp: 90 tablet, Rfl: 1    olmesartan  (BENICAR ) 20 MG tablet, Take 1 tablet (20 mg total) by mouth daily., Disp: 90 tablet, Rfl: 1  Observations/Objective: Patient is well-developed, well-nourished in no acute distress.  Resting comfortably at home.  Head is normocephalic, atraumatic.  No labored breathing.  Speech is clear and coherent with logical content.  Patient is alert and oriented at baseline.  Assessment and Plan: 1. Acute bacterial bronchitis (Primary) - promethazine -dextromethorphan (PROMETHAZINE -DM) 6.25-15 MG/5ML syrup; Take 5 mLs by mouth 4 (four) times daily as needed for cough.  Dispense: 118 mL; Refill: 0 - doxycycline  (VIBRA -TABS) 100 MG tablet; Take 1 tablet (100 mg total) by mouth 2 (two) times daily.  Dispense: 14 tablet; Refill: 0  Rx Doxycycline .  Increase fluids.  Rest.  Saline nasal spray.  Probiotic.  Mucinex  as directed.  Humidifier in bedroom. Promethazine -DM per orders. Continue Albuterol .  Call or return to clinic if symptoms are not improving.   Follow Up Instructions: I discussed the assessment and treatment plan with the patient. The patient was provided an opportunity to ask questions and all were answered. The patient agreed with the plan and demonstrated an understanding of the instructions.  A copy of instructions were sent to the patient via MyChart unless otherwise noted below.   The patient was advised to call back or seek an in-person evaluation if the symptoms worsen or if the condition fails to improve as anticipated.    Elsie Velma Lunger, PA-C

## 2024-03-17 NOTE — Patient Instructions (Signed)
 Dorn JONELLE Breen, thank you for joining Elsie Velma Lunger, PA-C for today's virtual visit.  While this provider is not your primary care provider (PCP), if your PCP is located in our provider database this encounter information will be shared with them immediately following your visit.   A [facility] MyChart account gives you access to today's visit and all your visits, tests, and labs performed at [facility]  click here if you don't have a [facility] MyChart account or go to [email or web address]  Consent: (Patient) YI HAUGAN provided verbal consent for this virtual visit at the beginning of the encounter.  Current Medications:  Current Outpatient Medications:    albuterol  (VENTOLIN  HFA) 108 (90 Base) MCG/ACT inhaler, INHALE 2 PUFFS INTO THE LUNGS EVERY 6 HOURS AS NEEDED FOR WHEEZE OR SHORTNESS OF BREATH, Disp: 6.7 each, Rfl: 2   atorvastatin  (LIPITOR) 40 MG tablet, Take 1 tablet (40 mg total) by mouth daily., Disp: 90 tablet, Rfl: 1   escitalopram  (LEXAPRO ) 20 MG tablet, Take 1 tablet (20 mg total) by mouth daily., Disp: 90 tablet, Rfl: 1   furosemide  (LASIX ) 40 MG tablet, Take 1 tablet (40 mg total) by mouth daily., Disp: 90 tablet, Rfl: 1   olmesartan  (BENICAR ) 20 MG tablet, Take 1 tablet (20 mg total) by mouth daily., Disp: 90 tablet, Rfl: 1   Medications ordered in this encounter:  No orders of the defined types were placed in this encounter.    *If you need refills on other medications prior to your next appointment, please contact your pharmacy*  Follow-Up: Call back or seek an in-person evaluation if the symptoms worsen or if the condition fails to improve as anticipated.  [facility] [phone]  Other Instructions Take antibiotic (Doxycycline ) as directed.  Increase fluids.  Get plenty of rest. Use Mucinex  for congestion. Take the prescription cough medication as directed. Take a daily probiotic (I recommend Align or Culturelle,  but even Activia Yogurt may be beneficial).  A humidifier placed in the bedroom may offer some relief for a dry, scratchy throat of nasal irritation.  Read information below on acute bronchitis. Please call or return to clinic if symptoms are not improving.  Acute Bronchitis Bronchitis is when the airways that extend from the windpipe into the lungs get red, puffy, and painful (inflamed). Bronchitis often causes thick spit (mucus) to develop. This leads to a cough. A cough is the most common symptom of bronchitis. In acute bronchitis, the condition usually begins suddenly and goes away over time (usually in 2 weeks). Smoking, allergies, and asthma can make bronchitis worse. Repeated episodes of bronchitis may cause more lung problems.  HOME CARE Rest. Drink enough fluids to keep your pee (urine) clear or pale yellow (unless you need to limit fluids as told by your doctor). Only take over-the-counter or prescription medicines as told by your doctor. Avoid smoking and secondhand smoke. These can make bronchitis worse. If you are a smoker, think about using nicotine gum or skin patches. Quitting smoking will help your lungs heal faster. Reduce the chance of getting bronchitis again by: Washing your hands often. Avoiding people with cold symptoms. Trying not to touch your hands to your mouth, nose, or eyes. Follow up with your doctor as told.  GET HELP IF: Your symptoms do not improve after 1 week of treatment. Symptoms include: Cough. Fever. Coughing up thick spit. Body aches. Chest congestion. Chills. Shortness of breath. Sore throat.  GET HELP RIGHT AWAY IF:  You have an increased fever. You have chills. You have severe shortness of breath. You have bloody thick spit (sputum). You throw up (vomit) often. You lose too much body fluid (dehydration). You have a severe headache. You faint.  MAKE SURE YOU:  Understand these instructions. Will watch your condition. Will get help  right away if you are not doing well or get worse. Document Released: 12/11/2007 Document Revised: 02/24/2013 Document Reviewed: 12/15/2012 [facility] Patient Information 2015 [place], [place]. This information is not intended to replace advice given to you by your health care provider. Make sure you discuss any questions you have with your health care provider.    If you have been instructed to have an in-person evaluation today at a local Urgent Care facility, please use the link below. It will take you to a list of all of our available [facility], including address, phone number and hours of operation. Please do not delay care.  [facility]  If you or a family member do not have a primary care provider, use the link below to schedule a visit and establish care. When you choose a [facility] primary care physician or advanced practice provider, you gain a long-term partner in health. Find a Primary Care Provider  Learn more about [facility]'s in-office and virtual care options: [facility] - Get Care Now

## 2024-04-30 ENCOUNTER — Ambulatory Visit: Admitting: Nurse Practitioner

## 2024-05-11 ENCOUNTER — Ambulatory Visit: Payer: Self-pay | Admitting: Nurse Practitioner

## 2024-05-11 ENCOUNTER — Encounter: Payer: Self-pay | Admitting: Nurse Practitioner

## 2024-05-11 VITALS — BP 104/68 | HR 78 | Temp 97.7°F | Ht 73.0 in | Wt 259.0 lb

## 2024-05-11 DIAGNOSIS — E7800 Pure hypercholesterolemia, unspecified: Secondary | ICD-10-CM

## 2024-05-11 DIAGNOSIS — Z23 Encounter for immunization: Secondary | ICD-10-CM | POA: Diagnosis not present

## 2024-05-11 DIAGNOSIS — F411 Generalized anxiety disorder: Secondary | ICD-10-CM

## 2024-05-11 DIAGNOSIS — R600 Localized edema: Secondary | ICD-10-CM

## 2024-05-11 DIAGNOSIS — F17200 Nicotine dependence, unspecified, uncomplicated: Secondary | ICD-10-CM

## 2024-05-11 DIAGNOSIS — I10 Essential (primary) hypertension: Secondary | ICD-10-CM

## 2024-05-11 DIAGNOSIS — F410 Panic disorder [episodic paroxysmal anxiety] without agoraphobia: Secondary | ICD-10-CM

## 2024-05-11 DIAGNOSIS — Z6833 Body mass index (BMI) 33.0-33.9, adult: Secondary | ICD-10-CM

## 2024-05-11 LAB — LIPID PANEL

## 2024-05-11 MED ORDER — FUROSEMIDE 40 MG PO TABS: 40.0000 mg | ORAL_TABLET | Freq: Every day | ORAL | 1 refills | Status: AC

## 2024-05-11 MED ORDER — OLMESARTAN MEDOXOMIL 20 MG PO TABS: 20.0000 mg | ORAL_TABLET | Freq: Every day | ORAL | 1 refills | Status: AC

## 2024-05-11 MED ORDER — ESCITALOPRAM OXALATE 20 MG PO TABS: 20.0000 mg | ORAL_TABLET | Freq: Every day | ORAL | 1 refills | Status: AC

## 2024-05-11 MED ORDER — ATORVASTATIN CALCIUM 40 MG PO TABS: 40.0000 mg | ORAL_TABLET | Freq: Every day | ORAL | 1 refills | Status: AC

## 2024-05-11 NOTE — Addendum Note (Signed)
 Addended by: GLADIS MUSTARD on: 05/11/2024 08:32 AM   Modules accepted: Level of Service

## 2024-05-11 NOTE — Progress Notes (Signed)
 Subjective:    Patient ID: Donald Reeves, male    DOB: 11/17/1965, 58 y.o.   MRN: [id number]   Chief Complaint: medical management of chronic issues     HPI:  Donald Reeves is a 58 y.o. who identifies as a male who was assigned male at birth.   Social history: Lives with: wife and daughters   Comes in today for follow up of the following chronic medical issues:  1. Primary hypertension No c/o chest pain, sob or headache. Does not check blood pressure at home. BP Readings from Last 3 Encounters:  12/11/23 (!) 98/53  10/30/23 111/74  05/01/23 118/70     2. Pure hypercholesterolemia Does try to watch diet and does stays active. Lab Results  Component Value Date   CHOL 127 10/30/2023   HDL 33 (L) 10/30/2023   LDLCALC 70 10/30/2023   TRIG 134 10/30/2023   CHOLHDL 3.8 10/30/2023     3. Donald Reeves. Panic attacks Is on lexapro  and is doing well.    11/Reeves/2025    8:05 AM 12/11/2023    9:32 AM Reeves/24/2025    9:17 AM  Depression screen PHQ 2/9  Decreased Interest 0 0 0  Down, Depressed, Hopeless 0 0 0  PHQ - 2 Score 0 0 0  Altered sleeping  0   Tired, decreased energy  0   Change in appetite  0   Feeling bad or failure about yourself   0   Trouble concentrating  0   Moving slowly or fidgety/restless  0   Suicidal thoughts  0   PHQ-9 Score  0   Difficult doing work/chores  Not difficult at all       11/Reeves/2025    8:05 AM 12/11/2023    9:33 AM Reeves/24/2025    9:17 AM 05/01/2023    9:19 AM  Donald 7 : Generalized Anxiety Score  Nervous, Anxious, on Edge 0 0 0 1  Control/stop worrying 0 0 0 0  Worry too much - different things 0 0 0 1  Trouble relaxing 0 0 0 1  Restless 0 0 0 0  Easily annoyed or irritable 0 0 0 0  Afraid - awful might happen 0 0 0 0  Total Donald 7 Score 0 0 0 3  Anxiety Difficulty Not difficult at all Not difficult at all Not difficult at all Not difficult at all      7. Smoker Smokes over a pack a day  6. BMI  33.0-33.9,adult No recent weight changes   Wt Readings from Last 3 Encounters:  05/11/24 259 lb (117.5 kg)  12/11/23 251 lb 12.8 oz (114.2 kg)  10/30/23 255 lb (115.7 kg)   BMI Readings from Last 3 Encounters:  05/11/24 34.17 kg/m  12/11/23 33.22 kg/m  10/30/23 33.64 kg/m      New complaints: None today  No Known Allergies Outpatient Encounter Medications as of 11/Reeves/2025  Medication Sig   albuterol  (VENTOLIN  HFA) 108 (90 Base) MCG/ACT inhaler INHALE 2 PUFFS INTO THE LUNGS EVERY 6 HOURS AS NEEDED FOR WHEEZE OR SHORTNESS OF BREATH   atorvastatin  (LIPITOR) 40 MG tablet Take 1 tablet (40 mg total) by mouth daily.   doxycycline  (VIBRA -TABS) 100 MG tablet Take 1 tablet (100 mg total) by mouth 2 (two) times daily.   escitalopram  (LEXAPRO ) 20 MG tablet Take 1 tablet (20 mg total) by mouth daily.   furosemide  (LASIX ) 40 MG tablet Take 1 tablet (40 mg total) by  mouth daily.   olmesartan  (BENICAR ) 20 MG tablet Take 1 tablet (20 mg total) by mouth daily.   promethazine -dextromethorphan (PROMETHAZINE -DM) 6.25-15 MG/5ML syrup Take 5 mLs by mouth Reeves (four) times daily as needed for cough.   Varenicline  Tartrate, Starter, (CHANTIX  STARTING MONTH PAK) 0.5 MG X 11 & 1 MG X 42 TBPK Take by mouth.   No facility-administered encounter medications on file as of 11/Reeves/2025.    Past Surgical History:  Procedure Laterality Date   EYE SURGERY     HERNIA REPAIR      Family History  Adopted: Yes  Problem Relation Age of Onset   Cancer Father       Controlled substance contract: n/a     Review of Systems  Constitutional:  Negative for diaphoresis.  Eyes:  Negative for pain.  Respiratory:  Negative for shortness of breath.   Cardiovascular:  Negative for chest pain, palpitations and leg swelling.  Gastrointestinal:  Negative for abdominal pain.  Endocrine: Negative for polydipsia.  Skin:  Negative for rash.  Neurological:  Negative for dizziness, weakness and headaches.   Hematological:  Does not bruise/bleed easily.  All other systems reviewed and are negative.      Objective:   Physical Exam Vitals and nursing note reviewed.  Constitutional:      Appearance: Normal appearance. He is well-developed.  HENT:     Head: Normocephalic.     Nose: Nose normal.     Mouth/Throat:     Mouth: Mucous membranes are moist.     Pharynx: Oropharynx is clear.  Eyes:     Pupils: Pupils are equal, round, and reactive to light.  Neck:     Thyroid: No thyroid mass or thyromegaly.     Vascular: No carotid bruit or JVD.     Trachea: Phonation normal.  Cardiovascular:     Rate and Rhythm: Normal rate and regular rhythm.  Pulmonary:     Effort: Pulmonary effort is normal. No respiratory distress.     Breath sounds: Normal breath sounds.  Abdominal:     General: Bowel sounds are normal.     Palpations: Abdomen is soft.     Tenderness: There is no abdominal tenderness.  Musculoskeletal:        General: Normal range of motion.     Cervical back: Normal range of motion and neck supple.  Lymphadenopathy:     Cervical: No cervical adenopathy.  Skin:    General: Skin is warm and dry.  Neurological:     Mental Status: He is alert and oriented to person, place, and time.  Psychiatric:        Behavior: Behavior normal.        Thought Content: Thought content normal.        Judgment: Judgment normal.    BP 104/68   Pulse 78   Temp 97.7 F (36.5 C) (Temporal)   Ht 6' 1 (1.854 m)   Wt 259 lb (117.5 kg)   SpO2 93%   BMI 34.17 kg/m        Assessment & Plan:  PIERCEN COVINO comes in today with chief complaint of medical management of chronic issues    Diagnosis and orders addressed:  1. Primary hypertension Low sodium diet - olmesartan  (BENICAR ) 20 MG tablet; Take 1 tablet (20 mg total) by mouth daily.  Dispense: 90 tablet; Refill: 1 - CBC with Differential/Platelet - CMP14+EGFR  2. Pure hypercholesterolemia Low fat diet - atorvastatin  (LIPITOR)  40 MG tablet; Take  1 tablet (40 mg total) by mouth daily.  Dispense: 90 tablet; Refill: 1 - Lipid panel  3. Donald (generalized anxiety disorder) Stress management  Reeves. Panic attacks - escitalopram  (LEXAPRO ) 20 MG tablet; Take 1 tablet (20 mg total) by mouth daily.  Dispense: 90 tablet; Refill: 1  5. Smoker Smoking cessation encouraged Ordered chantix   6. BMI 33.0-33.9,adult Discussed diet and exercise for person with BMI >25 Will recheck weight in 3-6 months   7. Peripheral edema Elevate legs when sitting - furosemide  (LASIX ) 40 MG tablet; Take 1 tablet (40 mg total) by mouth daily.  Dispense: 90 tablet; Refill: 1   Labs pending Health Maintenance reviewed Diet and exercise encouraged  Follow up plan: 6 months   Mary-Margaret Gladis, FNP

## 2024-05-12 LAB — CBC WITH DIFFERENTIAL/PLATELET
Basophils Absolute: 0.1 x10E3/uL (ref 0.0–0.2)
Basos: 1 %
EOS (ABSOLUTE): 0.3 x10E3/uL (ref 0.0–0.4)
Eos: 4 %
Hematocrit: 42.9 % (ref 37.5–51.0)
Hemoglobin: 14.5 g/dL (ref 13.0–17.7)
Immature Grans (Abs): 0 x10E3/uL (ref 0.0–0.1)
Immature Granulocytes: 0 %
Lymphocytes Absolute: 2 x10E3/uL (ref 0.7–3.1)
Lymphs: 23 %
MCH: 31.5 pg (ref 26.6–33.0)
MCHC: 33.8 g/dL (ref 31.5–35.7)
MCV: 93 fL (ref 79–97)
Monocytes Absolute: 0.7 x10E3/uL (ref 0.1–0.9)
Monocytes: 8 %
Neutrophils Absolute: 5.4 x10E3/uL (ref 1.4–7.0)
Neutrophils: 63 %
Platelets: 214 x10E3/uL (ref 150–450)
RBC: 4.61 x10E6/uL (ref 4.14–5.80)
RDW: 12.1 % (ref 11.6–15.4)
WBC: 8.5 x10E3/uL (ref 3.4–10.8)

## 2024-05-12 LAB — CMP14+EGFR
ALT: 22 IU/L (ref 0–44)
AST: 15 IU/L (ref 0–40)
Albumin: 4.4 g/dL (ref 3.8–4.9)
Alkaline Phosphatase: 102 IU/L (ref 47–123)
BUN/Creatinine Ratio: 12 (ref 9–20)
BUN: 10 mg/dL (ref 6–24)
Bilirubin Total: 0.4 mg/dL (ref 0.0–1.2)
CO2: 24 mmol/L (ref 20–29)
Calcium: 9.3 mg/dL (ref 8.7–10.2)
Chloride: 98 mmol/L (ref 96–106)
Creatinine, Ser: 0.82 mg/dL (ref 0.76–1.27)
Globulin, Total: 2.5 g/dL (ref 1.5–4.5)
Glucose: 107 mg/dL — AB (ref 70–99)
Potassium: 3.7 mmol/L (ref 3.5–5.2)
Sodium: 139 mmol/L (ref 134–144)
Total Protein: 6.9 g/dL (ref 6.0–8.5)
eGFR: 102 mL/min/1.73 (ref >59)

## 2024-05-12 LAB — LIPID PANEL
Cholesterol, Total: 119 mg/dL (ref 100–199)
HDL: 36 mg/dL — AB (ref >39)
LDL CALC COMMENT:: 3.3 ratio (ref 0.0–5.0)
LDL Chol Calc (NIH): 69 mg/dL (ref 0–99)
Triglycerides: 69 mg/dL (ref 0–149)
VLDL Cholesterol Cal: 14 mg/dL (ref 5–40)

## 2024-05-13 ENCOUNTER — Ambulatory Visit: Payer: Self-pay | Admitting: Nurse Practitioner

## 2024-11-04 ENCOUNTER — Ambulatory Visit: Admitting: Nurse Practitioner
